# Patient Record
Sex: Female | Born: 1965 | Race: White | Hispanic: No | Marital: Married | Smoking: Never smoker
Health system: Southern US, Community
[De-identification: ages and names within clinical notes are randomized; demographics above are authoritative.]

## PROBLEM LIST (undated history)

## (undated) DIAGNOSIS — J45909 Unspecified asthma, uncomplicated: Secondary | ICD-10-CM

## (undated) DIAGNOSIS — F419 Anxiety disorder, unspecified: Secondary | ICD-10-CM

## (undated) DIAGNOSIS — J189 Pneumonia, unspecified organism: Secondary | ICD-10-CM

## (undated) DIAGNOSIS — E119 Type 2 diabetes mellitus without complications: Secondary | ICD-10-CM

## (undated) DIAGNOSIS — K219 Gastro-esophageal reflux disease without esophagitis: Secondary | ICD-10-CM

## (undated) DIAGNOSIS — G709 Myoneural disorder, unspecified: Secondary | ICD-10-CM

## (undated) HISTORY — PX: TONSILLECTOMY: SUR1361

## (undated) HISTORY — PX: LAPAROSCOPIC GASTRIC BANDING: SHX1100

## (undated) HISTORY — DX: Unspecified asthma, uncomplicated: J45.909

## (undated) HISTORY — DX: Type 2 diabetes mellitus without complications: E11.9

## (undated) HISTORY — DX: Gastro-esophageal reflux disease without esophagitis: K21.9

## (undated) HISTORY — PX: CHOLECYSTECTOMY: SHX55

---

## 2014-09-25 ENCOUNTER — Other Ambulatory Visit: Payer: Self-pay | Admitting: Surgery

## 2014-11-01 ENCOUNTER — Ambulatory Visit: Payer: Self-pay | Admitting: Dietician

## 2014-11-26 ENCOUNTER — Ambulatory Visit (HOSPITAL_COMMUNITY)
Admission: RE | Admit: 2014-11-26 | Discharge: 2014-11-26 | Disposition: A | Discharge status: Home / Self Care | Payer: Managed Care, Other (non HMO) | Source: Ambulatory Visit | Attending: Surgery | Admitting: Surgery

## 2014-11-26 ENCOUNTER — Encounter (HOSPITAL_COMMUNITY)
Admission: RE | Disposition: A | Discharge status: Home / Self Care | Payer: Self-pay | Source: Ambulatory Visit | Attending: Surgery

## 2014-11-26 ENCOUNTER — Other Ambulatory Visit: Payer: Self-pay

## 2014-11-26 DIAGNOSIS — K224 Dyskinesia of esophagus: Secondary | ICD-10-CM | POA: Insufficient documentation

## 2014-11-26 DIAGNOSIS — Z9884 Bariatric surgery status: Secondary | ICD-10-CM | POA: Diagnosis not present

## 2014-11-26 HISTORY — PX: BREATH TEK H PYLORI: SHX5422

## 2014-11-26 SURGERY — BREATH TEST, FOR HELICOBACTER PYLORI

## 2014-11-27 ENCOUNTER — Encounter (HOSPITAL_COMMUNITY): Payer: Self-pay | Admitting: Surgery

## 2014-11-27 NOTE — Progress Notes (Signed)
   11/26/14 0714  BREATH TEK ASSESSMENT  Time of Last PO Intake 2000  Baseline Breath At: 0750  Pranactin Given At: 0752  Post-Dose Breath At: 0807  Sample 1 2.1  Sample 2 2.3  Test Negative

## 2015-01-03 ENCOUNTER — Encounter: Payer: Managed Care, Other (non HMO) | Attending: Surgery | Admitting: Dietician

## 2015-01-03 ENCOUNTER — Encounter: Payer: Self-pay | Admitting: Dietician

## 2015-01-03 VITALS — Ht 66.0 in | Wt 254.8 lb

## 2015-01-03 DIAGNOSIS — E669 Obesity, unspecified: Secondary | ICD-10-CM | POA: Insufficient documentation

## 2015-01-03 DIAGNOSIS — Z6841 Body Mass Index (BMI) 40.0 and over, adult: Secondary | ICD-10-CM | POA: Insufficient documentation

## 2015-01-03 DIAGNOSIS — Z713 Dietary counseling and surveillance: Secondary | ICD-10-CM | POA: Insufficient documentation

## 2015-01-03 NOTE — Progress Notes (Signed)
  Pre-Op Assessment Visit:  Pre-Operative RYGB Surgery  Medical Nutrition Therapy:  Appt start time: 0910   End time:  0940.  Patient was seen on 01/03/2015 for Pre-Operative Nutrition Assessment. Assessment and letter of approval faxed to [facility] Bariatric Surgery Program coordinator on 01/03/2015.   Preferred Learning Style:   No preference indicated   Learning Readiness:   Ready  Handouts given during visit include:  Pre-Op Goals Bariatric Surgery Protein Shakes   During the appointment today the following Pre-Op Goals were reviewed with the patient: Maintain or lose weight as instructed by your surgeon Make healthy food choices Begin to limit portion sizes Limited concentrated sugars and fried foods Keep fat/sugar in the single digits per serving on   food labels Practice CHEWING your food  (aim for 30 chews per bite or until applesauce consistency) Practice not drinking 15 minutes before, during, and 30 minutes after each meal/snack Avoid all carbonated beverages  Avoid/limit caffeinated beverages  Avoid all sugar-sweetened beverages Consume 3 meals per day; eat every 3-5 hours Make a list of non-food related activities Aim for 64-100 ounces of FLUID daily  Aim for at least 60-80 grams of PROTEIN daily Look for a liquid protein source that contain ?15 g protein and ?5 g carbohydrate  (ex: shakes, drinks, shots)  Patient-Centered Goals: Goals: would like to stop progression of diabetes, be more active with kids/family.  10 level of confidence/10 level of importance.  Demonstrated degree of understanding via:  Teach Back  Teaching Method Utilized:  Visual Auditory Hands on  Barriers to learning/adherence to lifestyle change: none  Patient to call the [facility] to enroll in Pre-Op and Post-Op Nutrition Education when surgery date is scheduled.

## 2015-01-03 NOTE — Patient Instructions (Signed)

## 2015-03-23 ENCOUNTER — Encounter: Payer: Managed Care, Other (non HMO) | Attending: Surgery

## 2015-03-23 DIAGNOSIS — Z6841 Body Mass Index (BMI) 40.0 and over, adult: Secondary | ICD-10-CM | POA: Diagnosis not present

## 2015-03-23 DIAGNOSIS — E669 Obesity, unspecified: Secondary | ICD-10-CM | POA: Diagnosis present

## 2015-03-23 DIAGNOSIS — Z713 Dietary counseling and surveillance: Secondary | ICD-10-CM | POA: Insufficient documentation

## 2015-03-24 NOTE — Progress Notes (Signed)
  Pre-Operative Nutrition Class:  Appt start time: 9518   End time:  1830.  Patient was seen on 03/23/15 for Pre-Operative Bariatric Surgery Education at the [facility].   Surgery date: 04/13/15 Surgery type: RYGB Start weight at [facility]: 255 lbs on 01/03/15 Weight today: 255.5 lbs  TANITA  BODY COMP RESULTS  03/23/15   BMI (kg/m^2) 41.2   Fat Mass (lbs) 119.5   Fat Free Mass (lbs) 136   Total Body Water (lbs) 99.5    Samples given per MNT protocol. Patient educated on appropriate usage: Celebrate Multivitamin chew (berry - qty 1) Lot #: [id number] Exp: 07/2016  Celebrate Calcium Citrate chew (caramel - qty 1) Lot #: [id number] Exp:07/2016  PB2 (chocolate - qty 1) Lot #: N/[id number] Exp: 03/2015  Renee Pain Protein Powder (chicken soup - qty 1) Lot #: [id number] Exp: 11/2015  The following the learning objectives were met by the patient during this course:  Identify Pre-Op Dietary Goals and will begin 2 weeks pre-operatively  Identify appropriate sources of fluids and proteins   State protein recommendations and appropriate sources pre and post-operatively  Identify Post-Operative Dietary Goals and will follow for 2 weeks post-operatively  Identify appropriate multivitamin and calcium sources  Describe the need for physical activity post-operatively and will follow MD recommendations  State when to call healthcare provider regarding medication questions or post-operative complications  Handouts given during class include:  Pre-Op Bariatric Surgery Diet Handout  Protein Shake Handout  Post-Op Bariatric Surgery Nutrition Handout  BELT Program Information Flyer  Support Group Information Flyer  [facility] Bariatric Supplements Price List  Follow-Up Plan: Patient will follow-up at [facility] 2 weeks post operatively for diet advancement per MD.

## 2015-03-26 ENCOUNTER — Other Ambulatory Visit: Payer: Self-pay | Admitting: Surgery

## 2015-04-09 ENCOUNTER — Inpatient Hospital Stay (HOSPITAL_COMMUNITY): Admission: RE | Admit: 2015-04-09 | Payer: Managed Care, Other (non HMO) | Source: Ambulatory Visit

## 2015-04-09 NOTE — Patient Instructions (Addendum)
Mia PickerelLori Gillie  04/09/2015   Your procedure is scheduled on: 04-13-15 Monday  [facility]  [facility] 3rd floor to  Short Stay Center at 0515  AM.  Call this number if you have problems the morning of surgery [phone]   Remember: ONLY 1 PERSON MAY GO WITH YOU TO SHORT STAY TO GET  READY MORNING OF YOUR SURGERY.  Do not eat food or drink liquids :After Midnight.              Bowel prep as instructed     Take these medicines the morning of surgery with A SIP OF WATER: Escitalopram (Lexapro). Ranitidine (Zantac). Use/bring Inhalers. DO NOT TAKE ANY DIABETIC MEDICATIONS DAY OF YOUR SURGERY                               You may not have any metal on your body including hair pins and              piercings  Do not wear jewelry, make-up, lotions, powders or perfumes, deodorant             Do not wear nail polish.  Do not shave  48 hours prior to surgery.               Do not bring valuables to the hospital. [facility] IS NOT             RESPONSIBLE   FOR VALUABLES.  Contacts, dentures or bridgework may not be worn into surgery.  Leave suitcase in the car. After surgery it may be brought to your room.     :  Special Instructions: coughing and deep breathing exercises, leg exercises              Please read over the following fact sheets you were given: _____________________________________________________________________             [facility] - Preparing for Surgery Before surgery, you can play an important role.  Because skin is not sterile, your skin needs to be as free of germs as possible.  You can reduce the number of germs on your skin by washing with CHG (chlorahexidine gluconate) soap before surgery.  CHG is an antiseptic cleaner which kills germs and bonds with the skin to continue killing germs even after washing. Please DO NOT use if you have an allergy to CHG or antibacterial soaps.  If your skin becomes  reddened/irritated stop using the CHG and inform your nurse when you arrive at Short Stay. Do not shave (including legs and underarms) for at least 48 hours prior to the first CHG shower.  You may shave your face/neck. Please follow these instructions carefully:  1.  Shower with CHG Soap the night before surgery and the  morning of Surgery.  2.  If you choose to wash your hair, wash your hair first as usual with your  normal  shampoo.  3.  After you shampoo, rinse your hair and body thoroughly to remove the  shampoo.                           4.  Use CHG as you would any other liquid soap.  You can apply chg directly  to the skin and wash  Gently with a scrungie or clean washcloth.  5.  Apply the CHG Soap to your body ONLY FROM THE NECK DOWN.   Do not use on face/ open                           Wound or open sores. Avoid contact with eyes, ears mouth and genitals (private parts).                       Wash face,  Genitals (private parts) with your normal soap.             6.  Wash thoroughly, paying special attention to the area where your surgery  will be performed.  7.  Thoroughly rinse your body with warm water from the neck down.  8.  DO NOT shower/wash with your normal soap after using and rinsing off  the CHG Soap.                9.  Pat yourself dry with a clean towel.            10.  Wear clean pajamas.            11.  Place clean sheets on your bed the night of your first shower and do not  sleep with pets. Day of Surgery : Do not apply any lotions/deodorants the morning of surgery.  Please wear clean clothes to the hospital/surgery center.  FAILURE TO FOLLOW THESE INSTRUCTIONS MAY RESULT IN THE CANCELLATION OF YOUR SURGERY PATIENT SIGNATURE_________________________________  NURSE SIGNATURE__________________________________  ________________________________________________________________________   Adam Phenix  An incentive spirometer is a tool that  can help keep your lungs clear and active. This tool measures how well you are filling your lungs with each breath. Taking long deep breaths may help reverse or decrease the chance of developing breathing (pulmonary) problems (especially infection) following:  A long period of time when you are unable to move or be active. BEFORE THE PROCEDURE   If the spirometer includes an indicator to show your best effort, your nurse or respiratory therapist will set it to a desired goal.  If possible, sit up straight or lean slightly forward. Try not to slouch.  Hold the incentive spirometer in an upright position. INSTRUCTIONS FOR USE  1. Sit on the edge of your bed if possible, or sit up as far as you can in bed or on a chair. 2. Hold the incentive spirometer in an upright position. 3. Breathe out normally. 4. Place the mouthpiece in your mouth and seal your lips tightly around it. 5. Breathe in slowly and as deeply as possible, raising the piston or the ball toward the top of the column. 6. Hold your breath for 3-5 seconds or for as long as possible. Allow the piston or ball to fall to the bottom of the column. 7. Remove the mouthpiece from your mouth and breathe out normally. 8. Rest for a few seconds and repeat Steps 1 through 7 at least 10 times every 1-2 hours when you are awake. Take your time and take a few normal breaths between deep breaths. 9. The spirometer may include an indicator to show your best effort. Use the indicator as a goal to work toward during each repetition. 10. After each set of 10 deep breaths, practice coughing to be sure your lungs are clear. If you have an incision (the cut made at the time of surgery),  support your incision when coughing by placing a pillow or rolled up towels firmly against it. Once you are able to get out of bed, walk around indoors and cough well. You may stop using the incentive spirometer when instructed by your caregiver.  RISKS AND  COMPLICATIONS  Take your time so you do not get dizzy or light-headed.  If you are in pain, you may need to take or ask for pain medication before doing incentive spirometry. It is harder to take a deep breath if you are having pain. AFTER USE  Rest and breathe slowly and easily.  It can be helpful to keep track of a log of your progress. Your caregiver can provide you with a simple table to help with this. If you are using the spirometer at home, follow these instructions: [place] IF:   You are having difficultly using the spirometer.  You have trouble using the spirometer as often as instructed.  Your pain medication is not giving enough relief while using the spirometer.  You develop fever of 100.5 F (38.1 C) or higher. SEEK IMMEDIATE MEDICAL CARE IF:   You cough up bloody sputum that had not been present before.  You develop fever of 102 F (38.9 C) or greater.  You develop worsening pain at or near the incision site. MAKE SURE YOU:   Understand these instructions.  Will watch your condition.  Will get help right away if you are not doing well or get worse. Document Released: 09/26/2006 Document Revised: 08/08/2011 Document Reviewed: 11/27/2006 [facility] Patient Information 2014 [place], [place].   ________________________________________________________________________

## 2015-04-10 ENCOUNTER — Encounter (HOSPITAL_COMMUNITY)
Admission: RE | Admit: 2015-04-10 | Discharge: 2015-04-10 | Disposition: A | Discharge status: Home / Self Care | Payer: Managed Care, Other (non HMO) | Source: Ambulatory Visit | Attending: Surgery | Admitting: Surgery

## 2015-04-10 ENCOUNTER — Encounter (HOSPITAL_COMMUNITY): Payer: Self-pay

## 2015-04-10 HISTORY — DX: Anxiety disorder, unspecified: F41.9

## 2015-04-10 HISTORY — DX: Pneumonia, unspecified organism: J18.9

## 2015-04-10 HISTORY — DX: Myoneural disorder, unspecified: G70.9

## 2015-04-10 LAB — CBC WITH DIFFERENTIAL/PLATELET
Basophils Absolute: 0.1 10*3/uL (ref 0.0–0.1)
Basophils Relative: 0 %
EOS ABS: 0.3 10*3/uL (ref 0.0–0.7)
EOS PCT: 3 %
HCT: 41.5 % (ref 36.0–46.0)
Hemoglobin: 13.6 g/dL (ref 12.0–15.0)
LYMPHS ABS: 2.5 10*3/uL (ref 0.7–4.0)
LYMPHS PCT: 22 %
MCH: 27.6 pg (ref 26.0–34.0)
MCHC: 32.8 g/dL (ref 30.0–36.0)
MCV: 84.3 fL (ref 78.0–100.0)
MONO ABS: 0.7 10*3/uL (ref 0.1–1.0)
Monocytes Relative: 7 %
Neutro Abs: 7.7 10*3/uL (ref 1.7–7.7)
Neutrophils Relative %: 68 %
PLATELETS: 292 10*3/uL (ref 150–400)
RBC: 4.92 MIL/uL (ref 3.87–5.11)
RDW: 14.3 % (ref 11.5–15.5)
WBC: 11.3 10*3/uL — ABNORMAL HIGH (ref 4.0–10.5)

## 2015-04-10 LAB — COMPREHENSIVE METABOLIC PANEL
ALT: 38 U/L (ref 14–54)
ANION GAP: 8 (ref 5–15)
AST: 23 U/L (ref 15–41)
Albumin: 4.5 g/dL (ref 3.5–5.0)
Alkaline Phosphatase: 70 U/L (ref 38–126)
BUN: 21 mg/dL — ABNORMAL HIGH (ref 6–20)
CHLORIDE: 106 mmol/L (ref 101–111)
CO2: 25 mmol/L (ref 22–32)
CREATININE: 0.71 mg/dL (ref 0.44–1.00)
Calcium: 9.6 mg/dL (ref 8.9–10.3)
Glucose, Bld: 109 mg/dL — ABNORMAL HIGH (ref 65–99)
POTASSIUM: 4.6 mmol/L (ref 3.5–5.1)
Sodium: 139 mmol/L (ref 135–145)
Total Bilirubin: 0.7 mg/dL (ref 0.3–1.2)
Total Protein: 8.1 g/dL (ref 6.5–8.1)

## 2015-04-10 NOTE — Progress Notes (Signed)
04-10-15 - talked with Dr. Council Mechanicenenny regarding pts. Upper GI/KUB results from 11-26-14 (mild to moderate esophageal dysmotility).  No further instructions given.

## 2015-04-10 NOTE — Progress Notes (Signed)
11-26-14 - EKG - EPIC 11-26-14 - Upper Abd/KUB (mild to moderate esophageal dysmotility) - EPIC

## 2015-04-10 NOTE — Progress Notes (Signed)
04-10-15 - at preop appt. On 04-10-15, pt. Stated she has pre-op appt. With surgeon on 04-10-15.  Will get bowel prep instructions then.

## 2015-04-12 NOTE — H&P (Signed)
Mia Horton  Location: [facility] Patient #: [id number] DOB: April 30, 1966 Married / Language: English / Race: White Female   History of Present Illness   Patient words: pre-op sleeve.   The patient is a 49 year old female who presents for a bariatric surgery evaluation.   Her PCP is dr. Elpidio Anis.   She comes by herself. She is from [place] near [place]. She is here because of her insurance. I operated on her husband, Gwendloyn Forsee , for RYGB on 03/23/2015. He is doing well.   She is for conversion of a lap band to RYGB on 04/13/2015. She has thought a lot about this. She is down about 30 pounds since I saw her in April, 2016.  I have been over with her the per op for surgery, the surgery and hospitalization, and the post op. The biggest unknown factor is the anatomy around the lap band she and can I get it out and continue the operation. The band looks in normal location in the UGI.  UGI - 11/26/2014 - normal band placement, some esophageal dysmotility She had a psych eval by Dr. Kimberlee Nearing of [place], [place], on 01/19/2015  History of weight issues:  She is here for weight loss surgery. Her initial weight is 284 and BMI is 45.87.  The patient had a lap band placed in 2007 in [place]. She did well and lost about 100 pounds. However, she had several personal issues which interfered with her life. She had a loss of job, her mother had cancer and died at age 70, she had financial problems. This led to her stopping her diet and stopping her exercise. She had an upper GI x-ray at the time that showed her lap band was too tight and all the fluid was removed from her lap band. She does not think she has much in her lap band. All these events occurred over 4 years ago.  She moved to the [place] about 3 years ago. Her husband is on disability. 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  She has tried multiple  diets including Weight Watchers, Atkins, and she's going to the gym. I spent some time talking to her about trying to again used her lap band. She's not really interested in this. She also lives in [place], near [place]. This would make transportation difficult between our office in her home.   Per the 1991 NIH Consensus Statement, the patient is a candidate for bariatric surgery. The patient attended an information session and reviewed the types of bariatric surgery.   The patient is interested removal of the lap band and in the Roux en Y Gastric Bypass. I discussed with the patient the indications and risks of bariatric surgery. The potential risks of surgery include, but are not limited to, bleeding, infection, leak from the bowel, DVT and PE, open surgery, long term nutrition consequences, and death. The patient understands the importance of compliance and long term follow-up with our group after surgery.  Past medical history: 1. Noncardiac chest pain 2. Asthma 3. Remote history of duodenal ulcer 4. History of cholecystectomy in 2000 5. Colonoscopy in 2010 for rectal bleeding 6. Juliann Pares IUD she's had for 2 years. 7. Back pain which is chronic. She is currently not seeing a physician for this. 8. Diabetes mellitus Hemoglobin A1c-7.7. She is on metformin 500 mg twice a day. 9. Anxiety. Struggle with her mother's death, in that she was closed her mother.  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 Has 2 sons ages 6516 and 6117.   Other Problems Kandis Cocking(Sohan Potvin H Tamilyn Lupien, MD; 04/10/2015 1:27 PM) Anxiety Disorder Arthritis Asthma Back Pain Cholelithiasis Depression Diabetes Mellitus Gastric Ulcer Gastroesophageal Reflux Disease Hemorrhoids Migraine Headache  Past Surgical History Kandis Cocking(Cianni Manny H Hanin Decook, MD; 04/10/2015 1:27  PM) Cesarean Section - Multiple Gallbladder Surgery - Laparoscopic Lap Band Oral Surgery Tonsillectomy  Allergies Doristine Devoid(Chemira Jones, CMA; 04/10/2015 12:30 PM) No Known Drug Allergies04/28/2016  Medication History Doristine Devoid(Chemira Jones, CMA; 04/10/2015 12:30 PM) Hydrocodone-Acetaminophen (5-325MG  Tablet, Oral) Active. ClonazePAM (1MG  Tablet, Oral) Active. Amitriptyline HCl (100MG  Tablet, Oral) Active. Clotrimazole-Betamethasone (1-0.05% Cream, External) Active. Escitalopram Oxalate (20MG  Tablet, Oral) Active. Gabapentin (300MG  Capsule, Oral) Active. MetFORMIN HCl ER (500MG  Tablet ER 24HR, Oral) Active. MetFORMIN HCl (850MG  Tablet, Oral) Active. ProAir HFA (108 (90 Base)MCG/ACT Aerosol Soln, Inhalation) Active. Vitamin D (Ergocalciferol) (50000UNIT Capsule, Oral) Active. Medications Reconciled  Vitals (Chemira Jones CMA; 04/10/2015 12:29 PM) 04/10/2015 12:29 PM Weight: 249.6 lb Height: 66in Body Surface Area: 2.2 m Body Mass Index: 40.29 kg/m  Temp.: 98.30F(Oral)  Pulse: 92 (Regular)  BP: 140/98 (Sitting, Left Arm, Standard)   Physical Exam: General: WN obese WF alert and generally healthy appearing. HEENT: Normal. Pupils equal.  Neck: Supple. No mass. No thyroid mass.  Lymph Nodes: No supraclavicular or cervical nodes.  Lungs: Clear to auscultation and symmetric breath sounds. Heart: RRR. No murmur or rub.  Abdomen: Soft. No mass. No tenderness. No hernia. Normal bowel sounds.  Lap band port in upper midline of abdomen. She is more apple than pear. The wieght loss had done her well.  Extremities: Good strength and ROM in upper and lower extremities. Tattoos of lower extremities.  Neurologic: Grossly intact to motor and sensory function.  Assessment & Plan  1.  MORBID OBESITY WITH BMI OF 45.0-49.9, ADULT (Z61.09(Z68.42)  Impression: Scheduled for surgery - removal of lap band and RYGB on Monday, 04/13/2015.   She is given her  bowel prep   She is given her post op oxycodone.  2.  HISTORY OF LAPAROSCOPIC ADJUSTABLE GASTRIC BANDING (Z98.84)  Story: This was placed in [place] in 2007.  Impression: Plan removal on 04/13/2015 - for conversion to RYGB  3. Asthma 4. Remote history of duodenal ulcer 5. History of cholecystectomy in 2000 6. Colonoscopy in 2010 for rectal bleeding 7. Juliann ParesMirvina IUD she's had for 2 years. 8. Back pain which is chronic. She is currently not seeing a physician for this. 9. Diabetes mellitus Hemoglobin A1c - 7.7. She is on metformin 500 mg twice a day. 10. Anxiety   Ovidio Kinavid Isami Mehra, MD, [facility] Pager: [phone] Office phone:  [phone]

## 2015-04-13 ENCOUNTER — Inpatient Hospital Stay (HOSPITAL_COMMUNITY): Payer: Managed Care, Other (non HMO) | Admitting: Certified Registered Nurse Anesthetist

## 2015-04-13 ENCOUNTER — Inpatient Hospital Stay (HOSPITAL_COMMUNITY)
Admission: AD | Admit: 2015-04-13 | Discharge: 2015-04-15 | DRG: 621 | Disposition: A | Discharge status: Home / Self Care | Payer: Managed Care, Other (non HMO) | Source: Ambulatory Visit | Attending: Surgery | Admitting: Surgery

## 2015-04-13 ENCOUNTER — Encounter (HOSPITAL_COMMUNITY)
Admission: AD | Disposition: A | Discharge status: Home / Self Care | Payer: Self-pay | Source: Ambulatory Visit | Attending: Surgery

## 2015-04-13 ENCOUNTER — Encounter (HOSPITAL_COMMUNITY): Payer: Self-pay | Admitting: *Deleted

## 2015-04-13 DIAGNOSIS — E119 Type 2 diabetes mellitus without complications: Secondary | ICD-10-CM | POA: Diagnosis present

## 2015-04-13 DIAGNOSIS — Z01812 Encounter for preprocedural laboratory examination: Secondary | ICD-10-CM | POA: Diagnosis not present

## 2015-04-13 DIAGNOSIS — J45909 Unspecified asthma, uncomplicated: Secondary | ICD-10-CM | POA: Diagnosis present

## 2015-04-13 DIAGNOSIS — F419 Anxiety disorder, unspecified: Secondary | ICD-10-CM | POA: Diagnosis present

## 2015-04-13 DIAGNOSIS — Z9884 Bariatric surgery status: Secondary | ICD-10-CM

## 2015-04-13 DIAGNOSIS — K660 Peritoneal adhesions (postprocedural) (postinfection): Secondary | ICD-10-CM | POA: Diagnosis present

## 2015-04-13 DIAGNOSIS — Z975 Presence of (intrauterine) contraceptive device: Secondary | ICD-10-CM | POA: Diagnosis not present

## 2015-04-13 DIAGNOSIS — G8929 Other chronic pain: Secondary | ICD-10-CM | POA: Diagnosis present

## 2015-04-13 DIAGNOSIS — Z7984 Long term (current) use of oral hypoglycemic drugs: Secondary | ICD-10-CM | POA: Diagnosis not present

## 2015-04-13 DIAGNOSIS — Z8711 Personal history of peptic ulcer disease: Secondary | ICD-10-CM

## 2015-04-13 DIAGNOSIS — M1990 Unspecified osteoarthritis, unspecified site: Secondary | ICD-10-CM | POA: Diagnosis present

## 2015-04-13 DIAGNOSIS — M549 Dorsalgia, unspecified: Secondary | ICD-10-CM | POA: Diagnosis present

## 2015-04-13 DIAGNOSIS — F329 Major depressive disorder, single episode, unspecified: Secondary | ICD-10-CM | POA: Diagnosis present

## 2015-04-13 DIAGNOSIS — Z6841 Body Mass Index (BMI) 40.0 and over, adult: Secondary | ICD-10-CM

## 2015-04-13 DIAGNOSIS — Z9049 Acquired absence of other specified parts of digestive tract: Secondary | ICD-10-CM

## 2015-04-13 DIAGNOSIS — K219 Gastro-esophageal reflux disease without esophagitis: Secondary | ICD-10-CM | POA: Diagnosis present

## 2015-04-13 DIAGNOSIS — Z9889 Other specified postprocedural states: Secondary | ICD-10-CM | POA: Diagnosis not present

## 2015-04-13 HISTORY — PX: GASTRIC ROUX-EN-Y: SHX5262

## 2015-04-13 LAB — GLUCOSE, CAPILLARY
GLUCOSE-CAPILLARY: 106 mg/dL — AB (ref 65–99)
GLUCOSE-CAPILLARY: 129 mg/dL — AB (ref 65–99)
GLUCOSE-CAPILLARY: 123 mg/dL — AB (ref 65–99)
Glucose-Capillary: 128 mg/dL — ABNORMAL HIGH (ref 65–99)
Glucose-Capillary: 120 mg/dL — ABNORMAL HIGH (ref 65–99)

## 2015-04-13 LAB — PREGNANCY, URINE: Preg Test, Ur: NEGATIVE

## 2015-04-13 LAB — HEMOGLOBIN AND HEMATOCRIT, BLOOD
HCT: 35.2 % — ABNORMAL LOW (ref 36.0–46.0)
Hemoglobin: 11.5 g/dL — ABNORMAL LOW (ref 12.0–15.0)

## 2015-04-13 SURGERY — LAPAROSCOPIC ROUX-EN-Y GASTRIC BYPASS WITH UPPER ENDOSCOPY
Anesthesia: General

## 2015-04-13 MED ORDER — CEFOTETAN DISODIUM-DEXTROSE 2-2.08 GM-% IV SOLR
INTRAVENOUS | Status: AC
  Filled 2015-04-13: qty 50

## 2015-04-13 MED ORDER — SUCCINYLCHOLINE CHLORIDE 20 MG/ML IJ SOLN
INTRAMUSCULAR | Status: DC | PRN
  Administered 2015-04-13: 100 mg via INTRAVENOUS

## 2015-04-13 MED ORDER — MIDAZOLAM HCL 2 MG/2ML IJ SOLN
INTRAMUSCULAR | Status: AC
  Filled 2015-04-13: qty 4

## 2015-04-13 MED ORDER — STERILE WATER FOR IRRIGATION IR SOLN
Status: DC | PRN
  Administered 2015-04-13: 1000 mL

## 2015-04-13 MED ORDER — TISSEEL VH 10 ML EX KIT
PACK | CUTANEOUS | Status: AC
  Filled 2015-04-13: qty 1

## 2015-04-13 MED ORDER — HEPARIN SODIUM (PORCINE) 5000 UNIT/ML IJ SOLN
5000.0000 [IU] | INTRAMUSCULAR | Status: AC
  Administered 2015-04-13: 5000 [IU] via SUBCUTANEOUS
  Filled 2015-04-13: qty 1

## 2015-04-13 MED ORDER — SUGAMMADEX SODIUM 200 MG/2ML IV SOLN
INTRAVENOUS | Status: DC | PRN
  Administered 2015-04-13: 200 mg via INTRAVENOUS

## 2015-04-13 MED ORDER — PREMIER PROTEIN SHAKE
2.0000 [oz_av] | Freq: Four times a day (QID) | ORAL | Status: DC
  Administered 2015-04-15: 2 [oz_av] via ORAL

## 2015-04-13 MED ORDER — OXYCODONE HCL 5 MG/5ML PO SOLN
5.0000 mg | ORAL | Status: DC | PRN
  Administered 2015-04-15 (×3): 10 mg via ORAL
  Filled 2015-04-13 (×3): qty 10

## 2015-04-13 MED ORDER — LIDOCAINE HCL (CARDIAC) 20 MG/ML IV SOLN
INTRAVENOUS | Status: AC
  Filled 2015-04-13: qty 5

## 2015-04-13 MED ORDER — ONDANSETRON HCL 4 MG/2ML IJ SOLN
INTRAMUSCULAR | Status: DC | PRN
  Administered 2015-04-13: 4 mg via INTRAVENOUS

## 2015-04-13 MED ORDER — FENTANYL CITRATE (PF) 250 MCG/5ML IJ SOLN
INTRAMUSCULAR | Status: AC
  Filled 2015-04-13: qty 25

## 2015-04-13 MED ORDER — PROPOFOL 10 MG/ML IV BOLUS
INTRAVENOUS | Status: DC | PRN
  Administered 2015-04-13: 200 mg via INTRAVENOUS

## 2015-04-13 MED ORDER — OXYCODONE HCL 5 MG/5ML PO SOLN
5.0000 mg | Freq: Once | ORAL | Status: DC | PRN
  Filled 2015-04-13: qty 5

## 2015-04-13 MED ORDER — FENTANYL CITRATE (PF) 100 MCG/2ML IJ SOLN
INTRAMUSCULAR | Status: AC
  Filled 2015-04-13: qty 4

## 2015-04-13 MED ORDER — MORPHINE SULFATE (PF) 2 MG/ML IV SOLN
2.0000 mg | INTRAVENOUS | Status: DC | PRN
  Administered 2015-04-13 (×2): 4 mg via INTRAVENOUS
  Administered 2015-04-13: 2 mg via INTRAVENOUS
  Administered 2015-04-14 – 2015-04-15 (×8): 4 mg via INTRAVENOUS
  Filled 2015-04-13: qty 1
  Filled 2015-04-13 (×10): qty 2

## 2015-04-13 MED ORDER — LIDOCAINE HCL (CARDIAC) 20 MG/ML IV SOLN
INTRAVENOUS | Status: DC | PRN
  Administered 2015-04-13: 50 mg via INTRAVENOUS

## 2015-04-13 MED ORDER — POTASSIUM CHLORIDE IN NACL 20-0.45 MEQ/L-% IV SOLN
INTRAVENOUS | Status: DC
  Administered 2015-04-13 (×2): 150 mL/h via INTRAVENOUS
  Administered 2015-04-14: via INTRAVENOUS
  Administered 2015-04-14: 150 mL/h via INTRAVENOUS
  Administered 2015-04-14: 11:00:00 via INTRAVENOUS
  Administered 2015-04-14: 150 mL/h via INTRAVENOUS
  Administered 2015-04-15: 07:00:00 via INTRAVENOUS
  Filled 2015-04-13 (×9): qty 1000

## 2015-04-13 MED ORDER — INSULIN ASPART 100 UNIT/ML ~~LOC~~ SOLN
0.0000 [IU] | SUBCUTANEOUS | Status: DC
  Administered 2015-04-13 (×2): 3 [IU] via SUBCUTANEOUS

## 2015-04-13 MED ORDER — SUGAMMADEX SODIUM 200 MG/2ML IV SOLN
INTRAVENOUS | Status: AC
  Filled 2015-04-13: qty 2

## 2015-04-13 MED ORDER — HYDROMORPHONE HCL 1 MG/ML IJ SOLN
INTRAMUSCULAR | Status: AC
  Filled 2015-04-13: qty 1

## 2015-04-13 MED ORDER — BUPIVACAINE HCL (PF) 0.25 % IJ SOLN
INTRAMUSCULAR | Status: AC
  Filled 2015-04-13: qty 30

## 2015-04-13 MED ORDER — LACTATED RINGERS IR SOLN
Status: DC | PRN
  Administered 2015-04-13: 1000 mL
  Administered 2015-04-13: 3000 mL

## 2015-04-13 MED ORDER — ROCURONIUM BROMIDE 100 MG/10ML IV SOLN
INTRAVENOUS | Status: AC
  Filled 2015-04-13: qty 1

## 2015-04-13 MED ORDER — HEPARIN SODIUM (PORCINE) 5000 UNIT/ML IJ SOLN
5000.0000 [IU] | Freq: Three times a day (TID) | INTRAMUSCULAR | Status: DC
  Administered 2015-04-13 – 2015-04-15 (×5): 5000 [IU] via SUBCUTANEOUS
  Filled 2015-04-13 (×8): qty 1

## 2015-04-13 MED ORDER — GLYCOPYRROLATE 0.2 MG/ML IJ SOLN
INTRAMUSCULAR | Status: AC
  Filled 2015-04-13: qty 1

## 2015-04-13 MED ORDER — CHLORHEXIDINE GLUCONATE 4 % EX LIQD: 60.0000 mL | Freq: Once | CUTANEOUS | Status: DC

## 2015-04-13 MED ORDER — SODIUM CHLORIDE 0.9 % IJ SOLN
INTRAMUSCULAR | Status: AC
Start: 2015-04-13 — End: 2015-04-13
  Filled 2015-04-13: qty 10

## 2015-04-13 MED ORDER — 0.9 % SODIUM CHLORIDE (POUR BTL) OPTIME
TOPICAL | Status: DC | PRN
  Administered 2015-04-13: 1000 mL

## 2015-04-13 MED ORDER — MIDAZOLAM HCL 5 MG/5ML IJ SOLN
INTRAMUSCULAR | Status: DC | PRN
  Administered 2015-04-13: 2 mg via INTRAVENOUS

## 2015-04-13 MED ORDER — FENTANYL CITRATE (PF) 250 MCG/5ML IJ SOLN
INTRAMUSCULAR | Status: DC | PRN
  Administered 2015-04-13: 100 ug via INTRAVENOUS
  Administered 2015-04-13: 150 ug via INTRAVENOUS
  Administered 2015-04-13 (×2): 100 ug via INTRAVENOUS

## 2015-04-13 MED ORDER — HYDROMORPHONE HCL 1 MG/ML IJ SOLN
INTRAMUSCULAR | Status: DC | PRN
  Administered 2015-04-13: 1 mg via INTRAVENOUS
  Administered 2015-04-13: 2 mg via INTRAVENOUS
  Administered 2015-04-13: 1 mg via INTRAVENOUS

## 2015-04-13 MED ORDER — ONDANSETRON HCL 4 MG/2ML IJ SOLN: 4.0000 mg | Freq: Four times a day (QID) | INTRAMUSCULAR | Status: DC | PRN

## 2015-04-13 MED ORDER — ACETAMINOPHEN 160 MG/5ML PO SOLN: 650.0000 mg | ORAL | Status: DC | PRN

## 2015-04-13 MED ORDER — ACETAMINOPHEN 160 MG/5ML PO SOLN: 325.0000 mg | ORAL | Status: DC | PRN

## 2015-04-13 MED ORDER — INFLUENZA VAC SPLIT QUAD 0.5 ML IM SUSY
0.5000 mL | PREFILLED_SYRINGE | INTRAMUSCULAR | Status: AC
  Administered 2015-04-15: 0.5 mL via INTRAMUSCULAR
  Filled 2015-04-13 (×2): qty 0.5

## 2015-04-13 MED ORDER — HYDROMORPHONE HCL 1 MG/ML IJ SOLN
0.2500 mg | INTRAMUSCULAR | Status: DC | PRN
  Administered 2015-04-13: 0.5 mg via INTRAVENOUS
  Administered 2015-04-13 (×2): 0.25 mg via INTRAVENOUS

## 2015-04-13 MED ORDER — ROCURONIUM BROMIDE 100 MG/10ML IV SOLN
INTRAVENOUS | Status: DC | PRN
  Administered 2015-04-13: 10 mg via INTRAVENOUS
  Administered 2015-04-13: 20 mg via INTRAVENOUS
  Administered 2015-04-13: 50 mg via INTRAVENOUS
  Administered 2015-04-13: 30 mg via INTRAVENOUS

## 2015-04-13 MED ORDER — HYDROMORPHONE HCL 2 MG/ML IJ SOLN
INTRAMUSCULAR | Status: AC
  Filled 2015-04-13: qty 1

## 2015-04-13 MED ORDER — ONDANSETRON HCL 4 MG/2ML IJ SOLN
INTRAMUSCULAR | Status: AC
  Filled 2015-04-13: qty 2

## 2015-04-13 MED ORDER — CEFOTETAN DISODIUM-DEXTROSE 2-2.08 GM-% IV SOLR
2.0000 g | INTRAVENOUS | Status: AC
  Administered 2015-04-13: 2 g via INTRAVENOUS

## 2015-04-13 MED ORDER — PROPOFOL 10 MG/ML IV BOLUS
INTRAVENOUS | Status: AC
  Filled 2015-04-13: qty 20

## 2015-04-13 MED ORDER — LACTATED RINGERS IV SOLN
INTRAVENOUS | Status: DC | PRN
  Administered 2015-04-13 (×3): via INTRAVENOUS

## 2015-04-13 MED ORDER — TISSEEL VH 10 ML EX KIT
PACK | CUTANEOUS | Status: DC | PRN
  Administered 2015-04-13 (×2): 10 mL

## 2015-04-13 MED ORDER — OXYCODONE HCL 5 MG PO TABS: 5.0000 mg | ORAL_TABLET | Freq: Once | ORAL | Status: DC | PRN

## 2015-04-13 MED ORDER — BUPIVACAINE HCL (PF) 0.25 % IJ SOLN
INTRAMUSCULAR | Status: DC | PRN
  Administered 2015-04-13: 30 mL

## 2015-04-13 MED ORDER — ONDANSETRON HCL 4 MG/2ML IJ SOLN
4.0000 mg | INTRAMUSCULAR | Status: DC | PRN
  Administered 2015-04-13 – 2015-04-14 (×2): 4 mg via INTRAVENOUS
  Filled 2015-04-13 (×2): qty 2

## 2015-04-13 SURGICAL SUPPLY — 78 items
APPLIER CLIP ROT 10 11.4 M/L (STAPLE)
APPLIER CLIP ROT 13.4 12 LRG (CLIP)
BLADE SURG 15 STRL LF DISP TIS (BLADE) ×1 IMPLANT
BLADE SURG 15 STRL SS (BLADE) ×2
CABLE HIGH FREQUENCY MONO STRZ (ELECTRODE) ×3 IMPLANT
CHLORAPREP W/TINT 26ML (MISCELLANEOUS) ×3 IMPLANT
CLIP APPLIE ROT 10 11.4 M/L (STAPLE) IMPLANT
CLIP APPLIE ROT 13.4 12 LRG (CLIP) IMPLANT
CLIP SUT LAPRA TY ABSORB (SUTURE) ×6 IMPLANT
COVER SURGICAL LIGHT HANDLE (MISCELLANEOUS) IMPLANT
DECANTER SPIKE VIAL GLASS SM (MISCELLANEOUS) IMPLANT
DEVICE SUT QUICK LOAD TK 5 (STAPLE) IMPLANT
DEVICE SUT TI-KNOT TK 5X26 (MISCELLANEOUS) IMPLANT
DEVICE SUTURE ENDOST 10MM (ENDOMECHANICALS) ×3 IMPLANT
DEVICE TI KNOT TK5 (MISCELLANEOUS)
DISSECTOR BLUNT TIP ENDO 5MM (MISCELLANEOUS) IMPLANT
DRAIN PENROSE 18X1/4 LTX STRL (WOUND CARE) ×3 IMPLANT
DRAPE CAMERA CLOSED 9X96 (DRAPES) ×3 IMPLANT
GAUZE SPONGE 4X4 16PLY XRAY LF (GAUZE/BANDAGES/DRESSINGS) ×3 IMPLANT
GLOVE BIO SURGEON STRL SZ7.5 (GLOVE) ×3 IMPLANT
GLOVE BIOGEL PI IND STRL 6.5 (GLOVE) ×2 IMPLANT
GLOVE BIOGEL PI IND STRL 7.0 (GLOVE) ×1 IMPLANT
GLOVE BIOGEL PI INDICATOR 6.5 (GLOVE) ×4
GLOVE BIOGEL PI INDICATOR 7.0 (GLOVE) ×2
GLOVE SURG SIGNA 7.5 PF LTX (GLOVE) ×3 IMPLANT
GOWN STRL REUS W/TWL XL LVL3 (GOWN DISPOSABLE) ×12 IMPLANT
HOVERMATT SINGLE USE (MISCELLANEOUS) ×3 IMPLANT
KIT BASIN OR (CUSTOM PROCEDURE TRAY) ×3 IMPLANT
KIT GASTRIC LAVAGE 34FR ADT (SET/KITS/TRAYS/PACK) ×3 IMPLANT
LIQUID BAND (GAUZE/BANDAGES/DRESSINGS) ×3 IMPLANT
LUBRICANT JELLY K Y 4OZ (MISCELLANEOUS) ×3 IMPLANT
NEEDLE SPNL 22GX3.5 QUINCKE BK (NEEDLE) ×3 IMPLANT
PACK CARDIOVASCULAR III (CUSTOM PROCEDURE TRAY) ×3 IMPLANT
PEN SKIN MARKING BROAD (MISCELLANEOUS) ×3 IMPLANT
QUICK LOAD TK 5 (STAPLE)
RELOAD 45 VASCULAR/THIN (ENDOMECHANICALS) ×6 IMPLANT
RELOAD ENDO STITCH 2.0 (ENDOMECHANICALS) ×24
RELOAD STAPLE TA45 3.5 REG BLU (ENDOMECHANICALS) ×3 IMPLANT
RELOAD STAPLER BLUE 60MM (STAPLE) ×1 IMPLANT
RELOAD STAPLER GOLD 60MM (STAPLE) ×4 IMPLANT
RELOAD STAPLER WHITE 60MM (STAPLE) IMPLANT
SCISSORS LAP 5X35 DISP (ENDOMECHANICALS) ×3 IMPLANT
SEALANT SURGICAL APPL DUAL CAN (MISCELLANEOUS) ×3 IMPLANT
SET IRRIG TUBING LAPAROSCOPIC (IRRIGATION / IRRIGATOR) ×3 IMPLANT
SHEARS HARMONIC ACE PLUS 36CM (ENDOMECHANICALS) ×3 IMPLANT
SLEEVE ADV FIXATION 12X100MM (TROCAR) IMPLANT
SLEEVE XCEL OPT CAN 5 100 (ENDOMECHANICALS) IMPLANT
SOLUTION ANTI FOG 6CC (MISCELLANEOUS) ×3 IMPLANT
STAPLE ECHEON FLEX 60 POW ENDO (STAPLE) ×3 IMPLANT
STAPLER RELOAD BLUE 60MM (STAPLE) ×3
STAPLER RELOAD GOLD 60MM (STAPLE) ×12
STAPLER RELOAD WHITE 60MM (STAPLE)
SUT MNCRL AB 4-0 PS2 18 (SUTURE) ×3 IMPLANT
SUT RELOAD ENDO STITCH 2 48X1 (ENDOMECHANICALS) ×6
SUT RELOAD ENDO STITCH 2.0 (ENDOMECHANICALS) ×6
SUT SURGIDAC NAB ES-9 0 48 120 (SUTURE) IMPLANT
SUT VIC AB 2-0 SH 27 (SUTURE) ×2
SUT VIC AB 2-0 SH 27X BRD (SUTURE) ×1 IMPLANT
SUT VIC AB 3-0 SH 18 (SUTURE) ×3 IMPLANT
SUTURE RELOAD END STTCH 2 48X1 (ENDOMECHANICALS) ×6 IMPLANT
SUTURE RELOAD ENDO STITCH 2.0 (ENDOMECHANICALS) ×6 IMPLANT
SYR 10ML ECCENTRIC (SYRINGE) ×3 IMPLANT
SYR 20CC LL (SYRINGE) ×6 IMPLANT
SYR 50ML LL SCALE MARK (SYRINGE) ×3 IMPLANT
TOWEL OR 17X26 10 PK STRL BLUE (TOWEL DISPOSABLE) ×3 IMPLANT
TRAY FOLEY W/METER SILVER 14FR (SET/KITS/TRAYS/PACK) ×3 IMPLANT
TRAY FOLEY W/METER SILVER 16FR (SET/KITS/TRAYS/PACK) IMPLANT
TROCAR ADV FIXATION 11X100MM (TROCAR) IMPLANT
TROCAR ADV FIXATION 12X100MM (TROCAR) IMPLANT
TROCAR ADV FIXATION 5X100MM (TROCAR) IMPLANT
TROCAR BLADELESS OPT 5 100 (ENDOMECHANICALS) ×3 IMPLANT
TROCAR UNIVERSAL OPT 12M 100M (ENDOMECHANICALS) IMPLANT
TROCAR XCEL 12X100 BLDLESS (ENDOMECHANICALS) IMPLANT
TROCAR XCEL NON-BLD 11X100MML (ENDOMECHANICALS) ×3 IMPLANT
TUBING CONNECTING 10 (TUBING) ×2 IMPLANT
TUBING CONNECTING 10' (TUBING) ×1
TUBING ENDO SMARTCAP (MISCELLANEOUS) ×3 IMPLANT
TUBING FILTER THERMOFLATOR (ELECTROSURGICAL) ×3 IMPLANT

## 2015-04-13 NOTE — Anesthesia Procedure Notes (Signed)
Procedure Name: Intubation Date/Time: 04/13/2015 7:23 AM Performed by: Izora GalaHUGHES, Koa Palla A Pre-anesthesia Checklist: Patient identified, Timeout performed, Emergency Drugs available, Suction available and Patient being monitored Patient Re-evaluated:Patient Re-evaluated prior to inductionOxygen Delivery Method: Circle system utilized Preoxygenation: Pre-oxygenation with 100% oxygen Intubation Type: IV induction Ventilation: Mask ventilation without difficulty Laryngoscope Size: Mac and 3 Grade View: Grade II Tube type: Oral Tube size: 7.5 mm Number of attempts: 2 (1st attempt by CRNA unsuccessful, 2nd attempt by Anesthesiologist) Airway Equipment and Method: Stylet Placement Confirmation: ETT inserted through vocal cords under direct vision,  breath sounds checked- equal and bilateral and positive ETCO2 Secured at: 22 cm Tube secured with: Tape Dental Injury: Teeth and Oropharynx as per pre-operative assessment

## 2015-04-13 NOTE — Interval H&P Note (Signed)
History and Physical Interval Note:  04/13/2015 7:04 AM  Mia Horton  has presented today for surgery, with the diagnosis of Morbid Obesity  The various methods of treatment have been discussed with the patient and family.  Her husband is here with her.   After consideration of risks, benefits and other options for treatment, the patient has consented to  Procedure(s): LAPAROSCOPIC ROUX-EN-Y GASTRIC BYPASS WITH UPPER ENDOSCOPY (N/A) as a surgical intervention .  The patient's history has been reviewed, patient examined, no change in status, stable for surgery.  I have reviewed the patient's chart and labs.  Questions were answered to the patient's satisfaction.     Treasa Bradshaw H

## 2015-04-13 NOTE — Op Note (Addendum)
PATIENT:   Mia Horton DOB:   [birth year] MRN:   [id number]  DATE OF PROCEDURE: 04/13/2015                   FACILITY:  [facility]  OPERATIVE REPORT  PREOPERATIVE DIAGNOSIS:  Morbid obesity.  POSTOPERATIVE DIAGNOSIS:  Morbid obesity (weight 245, BMI of 45.87).  PROCEDURE:  Remove of lap band, revision to laparoscopic Roux-en-Y gastric bypass, antecolic, antegastric (intraoperative upper endoscopy by Andrey Campanile)  SURGEON:  Sandria Bales. Ezzard Standing, MD  FIRST ASSISTANTFredonia Highland, MD  ANESTHESIA:  General endotracheal.  Anesthesiologist: Achille Reish, MD CRNA: Kizzie Fantasia, CRNA; Hali Marry, CRNA  General  ESTIMATED BLOOD LOSS:  Minimal.  LOCAL ANESTHESIA:  30 cc of 1/4% Marcaine  COMPLICATIONS:  None.  INDICATION FOR SURGERY:  Mia Horton is a 49 y.o. WF  female who sees Anitra Lauth, MD (FAX: [phone]) as her primary care doctor.  She has completed our preoperative bariatric program and now comes for a laparoscopic Roux-en-Y gastric bypass.  She had a lap band placed in [place] in 2007.  She did well early and lost about 100 pounds, but with time and different personal issues, she gained weight back the lap band became less effective.  The indications, potential complications of surgery were explained to the patient.  Potential complications of the surgery include, but are not limited to, bleeding, infection, DVT, open surgery, and long-term nutritional consequences.  She knows that if removal of the lap band damages the stomach, I may stop at that portion of the operation.  OPERATIVE NOTE:  The patient taken to room #1 at [facility] where Ms. Saavedra underwent a general endotracheal anesthetic, supervised by Anesthesiologist: Achille Kamara, MD CRNA: Kizzie Fantasia, CRNA; Hali Marry, CRNA.  The patient was given 2 g of cefoxitin at the beginning of the procedure.  A time-out was held and surgical checklist run.  The abdomen was prepped with ChloraPrep and sterilely draped.   I accessed the abdominal cavity through the left upper quadrant using a 12 mm Optiview trocar.  I placed 5 additional trocars: 5 mm subxiphoid, 12 mm right subcostal, 12 mm right paramedian, 12 mm left paramedian, and 5 mm in the left lateral subcostal.  The abdomen was insufflated and abdominal exploration carried out.  Right and left lobes of liver unremarkable.  The stomach that I could see was unremarkable.  The patient had a moderate amount of greater omentum which draped over the bowel.  She did have adhesions of the omentum to the lower anterior peritoneal surface.  I spent about 10 minutes taking these adhesions down.  The lap band lay in normal position.  A photo was taken and placed in the chart of the initial appearance of the lap band.  There were moderate adhesions to the undersurface of the liver over the lap band.  These were taken down sharply and with energy.  I made a subxiphoid 5 mm trocar puncture, this allowed me to introduce the liver retractor under the left lobe of the liver.   I was then able to cut the lap band and removed it.  There was a circatrix of the band on the stomach.  The scar did not seem overly thick or nonpliable, therefore I did not divide the scar.  I did divide the sutures over the lap band and I found 3 sutures and divided these.  I was able to get the dissection to the Angle of Hiss.  There were also some adhesions along the lesser curvature, below the primary scar around the lap band, and these were divided.   After removing the lap band, the remaining stomach looked in good condition to continue the gastric bypass.  I was able to push the omentum and transverse colon up and identified the ligament of Treitz to start the operation.  I measured 40 cm of the jejunum, starting at the ligament of Tritz, and divided the jejunum with a white load of 45 mm Ethicon Endo-GIA stapler.  I divided a short length into the mesentery.  I measured 100 cm of jejunum for the  future gastric limb.  I put a Penrose drain on the future gastric limb of the jejunum.  I then did a side-to-side jejunojejunostomy.  I used a 45 mm white load of the Ethicon Endo-GIA stapler.  I closed the enterotomy with 2 running 2-0 Vicryl sutures.  I tested the JJ anastomosis with an alligator forceps and then covered this with Tisseel.  I closed the mesenteric defect with a running 2-0 silk suture with a Laparo-tye on each end.  I then divided the omentum with a Harmonic Scalpel.  I positioned the patient in reverse Trendelenburg.  The liver retractor was already in place for the dissection of the lap band.  I then identified the gastroesophageal junction.  I went to the left at the angle of His and made a window at the left side of the esophago-gastric junction for a target as my dissection.  I then went on the lesser curve of the stomach, measured 5 cm from the gastroesophageal junction down the lesser curve and dissected into the lesser sac from the lesser curvature side of the stomach.  I did the first firing of a 60 mm gold load Ethicon Endo-GIA stapler and then did 4 firings of the 60 mm gold load Ethicon Eschelon stapler.  I used the gold loads because the stomach seemed thicker secondary to the prior lap band surgery.  This created a gastric pouch approximately 5 cm in length and 3 cm in width.  There was no bleeding from either the pouch or the stomach remnant site.  I placed Tisseel on the pouch side along the new greater curvature.  I over sewed the gastric remnant with a locking 2-0 Vicryl suture with a Laparo-tye on each end..  I then brought the jejunum ante-colic, ante-gastric up to the new stomach pouch and placed a posterior running 2-0 Vicryl suture.  I then made an enterotomy into the stomach using the Ewald as a back stop and an enterotomy into the jejunum.  I did a stapled side-to-side gastrojejunal anastomosis using these two enterotomies with a 45 mm blue load of the  Ethicon Endo GIA stapler.  I tried to create a 2.5 cm gastrojejunal anastomosis.  I closed the enterotomy with a 2 running 2-0 Vicryl sutures.  I passed the Ewald tube through the gastrojejunal anastomosis and then did an anterior Connell suture running of 2-0 Vicryl suture for the anterior layer of the gastrojejunostomy.  The Ewald tube was then removed without difficulty.  I then closed the [place] defect with a figure-of-eight 2-0 silk suture between the mesentery of the transverse colon and the mesentery of the distal jejunum.  Dr. Andrey CampanileWilson then scrubbed out and did an intraoperative upper endoscopy.  He identified the esophagogastric junction about 42 cm, the gastrojejunal anastomosis about 48 cm.  I clamped off the small bowel.  He insufflated air and  I flooded the abdomen with saline. There was no bubbling or evidence of air leak.  He then withdrew the scope and he will dictate that portion of the operation.    I removed the port from the RUQ incision.  The port was held by 4 braided sutures - these were cut.  There was a minimal hole to the peritoneal cavity.  I closed the subcutaneous tissues with 3-0 vicryl suture.  The skin was closed with 4-0 monocryl.  I then re-inspected the anastomoses, sucked out the saline, placed Tisseel over the stomach pouch and gastrojejunal anastomosis.   The liver retractor was removed.  The trocars were removed.  There was no bleeding at any trocar site.  The skin at each trocar site was closed with a 4-0 Monocryl suture.  I infiltrated a total about 30 cc of 0.25% Marcaine at the trocar sites.    After the skin incisions were closed with sutures they were painted with LiquidBand.  The sponge and needle count were correct at the end of the case.  The patient tolerated the procedure well, was transported to the recovery room in good condition.   Ovidio Kin, MD, [facility] Surgery Pager: [phone] Office phone:  [phone]

## 2015-04-13 NOTE — Anesthesia Postprocedure Evaluation (Signed)
Anesthesia Post Note  Patient: Mia Horton  Procedure(s) Performed: Procedure(s) (LRB): LAPAROSCOPIC ROUX-EN-Y GASTRIC BYPASS WITH UPPER ENDOSCOPY, removal of lap band  (N/A)  Anesthesia type: General  Patient location: PACU  Post pain: Pain level controlled and Adequate analgesia  Post assessment: Post-op Vital signs reviewed, Patient's Cardiovascular Status Stable, Respiratory Function Stable, Patent Airway and Pain level controlled  Last Vitals:  Filed Vitals:   04/13/15 1335  BP: 121/61  Pulse: 112  Temp: 37.1 C  Resp: 12    Post vital signs: Reviewed and stable  Level of consciousness: awake, alert  and oriented  Complications: No apparent anesthesia complications

## 2015-04-13 NOTE — Transfer of Care (Signed)
Immediate Anesthesia Transfer of Care Note  Patient: Mia Horton  Procedure(s) Performed: Procedure(s): LAPAROSCOPIC ROUX-EN-Y GASTRIC BYPASS WITH UPPER ENDOSCOPY, removal of lap band  (N/A)  Patient Location: PACU  Anesthesia Type:General  Level of Consciousness: awake, alert  and oriented  Airway & Oxygen Therapy: Patient Spontanous Breathing and Patient connected to face mask oxygen  Post-op Assessment: Report given to RN and Post -op Vital signs reviewed and stable  Post vital signs: Reviewed and stable  Last Vitals:  Filed Vitals:   04/13/15 0530  BP: 144/80  Pulse: 99  Temp: 36.3 C  Resp: 18    Complications: No apparent anesthesia complications

## 2015-04-13 NOTE — Op Note (Signed)
Mia PickerelLori Horton [id number] 05/01/1966 04/13/2015  Preoperative diagnosis: morbid obesity; h/o adjustable gastric band placement, conversion to laparoscopic roux en y gastric bypass  Postoperative diagnosis: Same   Procedure: Upper endoscopy   Surgeon: Atilano InaEric M Wyatte Dames M.D., FACS   Anesthesia: Gen.   Indications for procedure: 49 yo female undergoing a conversion from laparoscopic adjustable gastric band to laparoscopic roux en y gastric bypass and an upper endoscopy was requested to evaluate the anastomosis.  Description of procedure: After we have completed the new gastrojejunostomy, I scrubbed out and obtained the Olympus endoscope. I gently placed endoscope in the patient's oropharynx and gently glided it down the esophagus without any difficulty under direct visualization. Once I was in the gastric pouch, I insufflated the pouch was air. The pouch was approximately 8-9cm (GE junction at 42 cm, anastomosis at 50cm) in size. I was able to cannulate and advanced the scope through the gastrojejunostomy. There was evidence of the prior gastric band cicatrix impression on the upper pouch but no evidence of stricture; this impression was above the anastomosis giving the pouch an hourglass appearance. Dr. Ezzard StandingNewman had placed saline in the upper abdomen. Upon further insufflation of the gastric pouch there was no evidence of bubbles. Upon further inspection of the gastric pouch, the mucosa appeared normal. There is no evidence of any mucosal abnormality. The gastric pouch and Roux limb were decompressed. The width of the gastrojejunal anastomosis was at least 2.5 cm. The scope was withdrawn. The patient tolerated this portion of the procedure well. Please see Dr Allene PyoNewman's operative note for details regarding the laparoscopic roux-en-y gastric bypass.  Mary SellaEric M. Andrey CampanileWilson, MD, FACS General, Bariatric, & Minimally Invasive Surgery [facility] [place]

## 2015-04-13 NOTE — Anesthesia Preprocedure Evaluation (Signed)
Anesthesia Evaluation  Patient identified by MRN, date of birth, ID band Patient awake    Reviewed: Allergy & Precautions, NPO status , Patient's Chart, lab work & pertinent test results  Airway Mallampati: II   Neck ROM: full    Dental   Pulmonary asthma ,    breath sounds clear to auscultation       Cardiovascular negative cardio ROS   Rhythm:regular Rate:Normal     Neuro/Psych Anxiety    GI/Hepatic GERD  ,  Endo/Other  diabetes, Type obesity  Renal/GU      Musculoskeletal   Abdominal   Peds  Hematology   Anesthesia Other Findings   Reproductive/Obstetrics                             Anesthesia Physical Anesthesia Plan  ASA: II  Anesthesia Plan: General   Post-op Pain Management:    Induction: Intravenous  Airway Management Planned: Oral ETT  Additional Equipment:   Intra-op Plan:   Post-operative Plan: Extubation in OR  Informed Consent: I have reviewed the patients History and Physical, chart, labs and discussed the procedure including the risks, benefits and alternatives for the proposed anesthesia with the patient or authorized representative who has indicated his/her understanding and acceptance.     Plan Discussed with: CRNA, Anesthesiologist and Surgeon  Anesthesia Plan Comments:         Anesthesia Quick Evaluation

## 2015-04-14 ENCOUNTER — Inpatient Hospital Stay (HOSPITAL_COMMUNITY): Payer: Managed Care, Other (non HMO)

## 2015-04-14 DIAGNOSIS — Z9889 Other specified postprocedural states: Secondary | ICD-10-CM

## 2015-04-14 LAB — CBC WITH DIFFERENTIAL/PLATELET
Basophils Absolute: 0 10*3/uL (ref 0.0–0.1)
Basophils Relative: 0 %
EOS ABS: 0.2 10*3/uL (ref 0.0–0.7)
Eosinophils Relative: 3 %
HCT: 34 % — ABNORMAL LOW (ref 36.0–46.0)
HEMOGLOBIN: 11.2 g/dL — AB (ref 12.0–15.0)
LYMPHS ABS: 1.4 10*3/uL (ref 0.7–4.0)
LYMPHS PCT: 18 %
MCH: 28.1 pg (ref 26.0–34.0)
MCHC: 32.9 g/dL (ref 30.0–36.0)
MCV: 85.4 fL (ref 78.0–100.0)
Monocytes Absolute: 0.6 10*3/uL (ref 0.1–1.0)
Monocytes Relative: 8 %
NEUTROS PCT: 71 %
Neutro Abs: 5.6 10*3/uL (ref 1.7–7.7)
Platelets: 204 10*3/uL (ref 150–400)
RBC: 3.98 MIL/uL (ref 3.87–5.11)
RDW: 14.5 % (ref 11.5–15.5)
WBC: 7.8 10*3/uL (ref 4.0–10.5)

## 2015-04-14 LAB — GLUCOSE, CAPILLARY
GLUCOSE-CAPILLARY: 94 mg/dL (ref 65–99)
GLUCOSE-CAPILLARY: 92 mg/dL (ref 65–99)
GLUCOSE-CAPILLARY: 90 mg/dL (ref 65–99)
Glucose-Capillary: 104 mg/dL — ABNORMAL HIGH (ref 65–99)
Glucose-Capillary: 89 mg/dL (ref 65–99)
Glucose-Capillary: 96 mg/dL (ref 65–99)

## 2015-04-14 LAB — HEMOGLOBIN AND HEMATOCRIT, BLOOD
HCT: 33.3 % — ABNORMAL LOW (ref 36.0–46.0)
HEMOGLOBIN: 10.9 g/dL — AB (ref 12.0–15.0)

## 2015-04-14 MED ORDER — IOHEXOL 300 MG/ML  SOLN
50.0000 mL | Freq: Once | INTRAMUSCULAR | Status: DC | PRN
  Administered 2015-04-14: 50 mL via ORAL
  Filled 2015-04-14: qty 50

## 2015-04-14 NOTE — Progress Notes (Signed)
General Surgery Note  LOS: 1 day  POD -  1 Day Post-Op  Assessment/Plan: 1.  LAPAROSCOPIC ROUX-EN-Y GASTRIC BYPASS WITH UPPER ENDOSCOPY, removal of lap band   MORBID OBESITY WITH BMI OF 45.0-49.9, ADULT   For swallow today  2. HISTORY OF LAPAROSCOPIC ADJUSTABLE GASTRIC BANDING (Z98.84) Story: This was [place] in 2007.  3. Asthma 4. Remote history of duodenal ulcer 5. History of cholecystectomy in 2000 6. Colonoscopy in 2010 for rectal bleeding 7. Juliann ParesMirvina IUD she's had for 2 years. 8. Back pain which is chronic. She is currently not seeing a physician for this. 9. Diabetes mellitus  Glucose 94 - 04/14/2015 10. Anxiety 11.  DVT prophylaxis - SQ Heparin   Active Problems:   Morbid obesity (HCC)  Subjective:  She is sorer than she thought that she would be.  But is up walking and moving well. Objective:   Filed Vitals:   04/14/15 0530  BP: 129/62  Pulse: 84  Temp: 98.6 F (37 C)  Resp: 18     Intake/Output from previous day:  11/14 0701 - 11/15 0700 In: 5002.5 [I.V.:5002.5] Out: 2300 [Urine:2100; Blood:200]  Intake/Output this shift:  Total I/O In: -  Out: 600 [Urine:600]   Physical Exam:   General: Obese wF who is alert and oriented.    HEENT: Normal. Pupils equal. .   Lungs: Clear.  IS - 2,000 cc   Abdomen: Soft, quiet.   Wound: Clean   Lab Results:    Recent Labs  04/13/15 1740 04/14/15 0500  WBC  --  7.8  HGB 11.5* 11.2*  HCT 35.2* 34.0*  PLT  --  204    BMET  No results for input(s): NA, K, CL, CO2, GLUCOSE, BUN, CREATININE, CALCIUM in the last 72 hours.  PT/INR  No results for input(s): LABPROT, INR in the last 72 hours.  ABG  No results for input(s): PHART, HCO3 in the last 72 hours.  Invalid input(s): PCO2, PO2   Studies/Results:  No results found.   Anti-infectives:   Anti-infectives    Start     Dose/Rate Route Frequency Ordered Stop   04/13/15 0528  cefoTEtan in  Dextrose 5% (CEFOTAN) IVPB 2 g     2 g Intravenous On call to O.R. 04/13/15 0528 04/13/15 0725      Ovidio Kinavid Mishawn Didion, MD, FACS Pager: [phone] [facility] Office: [phone] 04/14/2015

## 2015-04-14 NOTE — Care Management Note (Signed)
Case Management Note  Patient Details  Name: Greer PickerelLori Irmen MRN: [id number] Date of Birth: [birth year]  Subjective/Objective:       Remove of lap band, revision to laparoscopic Roux-en-Y gastric bypass             Action/Plan: Discharge planning, no needs  Expected Discharge Date:                  Expected Discharge Plan:  Home/Self Care  In-House Referral:  NA  Discharge planning Services  CM Consult  Post Acute Care Choice:  NA Choice offered to:  NA  DME Arranged:  N/A DME Agency:  NA  HH Arranged:  NA HH Agency:  NA  Status of Service:  Completed, signed off  Medicare Important Message Given:    Date Medicare IM Given:    Medicare IM give by:    Date Additional Medicare IM Given:    Additional Medicare Important Message give by:     If discussed at Long Length of Stay Meetings, dates discussed:    Additional Comments:  Alexis Goodelleele, Clanton Emanuelson K, RN 04/14/2015, 12:38 PM

## 2015-04-14 NOTE — Progress Notes (Signed)
VASCULAR LAB PRELIMINARY  PRELIMINARY  PRELIMINARY  PRELIMINARY  Bilateral lower extremity venous duplex completed.    Preliminary report:  Bilateral:  No evidence of DVT, superficial thrombosis, or Baker's Cyst.   Jovonta Levit, RVS 04/14/2015, 8:39 AM

## 2015-04-14 NOTE — Progress Notes (Signed)
Patient alert and oriented, Post op day 1.  Provided support and encouragement.  Encouraged pulmonary toilet, ambulation and small sips of liquids.  All questions answered.  Will continue to monitor. 

## 2015-04-15 LAB — CBC WITH DIFFERENTIAL/PLATELET
BASOS ABS: 0 10*3/uL (ref 0.0–0.1)
BASOS PCT: 0 %
Eosinophils Absolute: 0.5 10*3/uL (ref 0.0–0.7)
Eosinophils Relative: 6 %
HEMATOCRIT: 32.7 % — AB (ref 36.0–46.0)
HEMOGLOBIN: 10.4 g/dL — AB (ref 12.0–15.0)
LYMPHS PCT: 20 %
Lymphs Abs: 1.6 10*3/uL (ref 0.7–4.0)
MCH: 26.9 pg (ref 26.0–34.0)
MCHC: 31.8 g/dL (ref 30.0–36.0)
MCV: 84.5 fL (ref 78.0–100.0)
MONO ABS: 0.7 10*3/uL (ref 0.1–1.0)
MONOS PCT: 8 %
NEUTROS ABS: 5.2 10*3/uL (ref 1.7–7.7)
NEUTROS PCT: 66 %
Platelets: 171 10*3/uL (ref 150–400)
RBC: 3.87 MIL/uL (ref 3.87–5.11)
RDW: 14.5 % (ref 11.5–15.5)
WBC: 7.9 10*3/uL (ref 4.0–10.5)

## 2015-04-15 LAB — GLUCOSE, CAPILLARY
GLUCOSE-CAPILLARY: 100 mg/dL — AB (ref 65–99)
GLUCOSE-CAPILLARY: 92 mg/dL (ref 65–99)
Glucose-Capillary: 100 mg/dL — ABNORMAL HIGH (ref 65–99)
Glucose-Capillary: 97 mg/dL (ref 65–99)

## 2015-04-15 NOTE — Progress Notes (Addendum)
General Surgery Note  LOS: 2 days  POD -  2 Days Post-Op  Assessment/Plan: 1.  LAPAROSCOPIC ROUX-EN-Y GASTRIC BYPASS WITH UPPER ENDOSCOPY, removal of lap band   MORBID OBESITY WITH BMI OF 45.0-49.9, ADULT   Swallow okay - taking water.  Feels better today.  Will start protein drinks  She wants to go home today.  I reviewed discharge orders.  It she tolerates protein drinks, will let go home later today.  2. HISTORY OF LAPAROSCOPIC ADJUSTABLE GASTRIC BANDING (Z98.84) Story: This was placed in [place] in 2007.  3. Asthma 4. Remote history of duodenal ulcer 5. History of cholecystectomy in 2000 6. Colonoscopy in 2010 for rectal bleeding 7. Juliann ParesMirvina IUD she's had for 2 years. 8. Back pain which is chronic. She is currently not seeing a physician for this. 9. Diabetes mellitus  Glucose 100 - 04/15/2015 10. Anxiety 11.  DVT prophylaxis - SQ Heparin   Active Problems:   Morbid obesity (HCC)  Subjective:  Doing well.  Tolerating water. Wants to go home today.  Complains of some swelling of the hands.  In room by herself. Objective:   Filed Vitals:   04/15/15 0500  BP: 140/76  Pulse: 95  Temp: 98.1 F (36.7 C)  Resp: 18     Intake/Output from previous day:  11/15 0701 - 11/16 0700 In: 3840 [P.O.:240; I.V.:3600] Out: 4100 [Urine:4100]  Intake/Output this shift:      Physical Exam:   General: Obese wF who is alert and oriented.    HEENT: Normal. Pupils equal. .   Lungs: Clear.     Abdomen: Soft, has active BS.   Wound: Clean   Lab Results:     Recent Labs  04/14/15 0500 04/14/15 1612 04/15/15 0505  WBC 7.8  --  7.9  HGB 11.2* 10.9* 10.4*  HCT 34.0* 33.3* 32.7*  PLT 204  --  171    BMET  No results for input(s): NA, K, CL, CO2, GLUCOSE, BUN, CREATININE, CALCIUM in the last 72 hours.  PT/INR  No results for input(s): LABPROT, INR in the last 72 hours.  ABG  No results for input(s): PHART, HCO3 in the  last 72 hours.  Invalid input(s): PCO2, PO2   Studies/Results:  Dg Ugi W/water Sol Cm  04/14/2015  CLINICAL DATA:  Postop for gastric bypass.  Lap band revision. EXAM: WATER SOLUBLE UPPER GI SERIES TECHNIQUE: Single-column upper GI series was performed using water soluble contrast. CONTRAST:  50mL OMNIPAQUE IOHEXOL 300 MG/ML  SOLN COMPARISON:  11/26/2014 FLUOROSCOPY TIME:  If the device does not provide the exposure index: Fluoroscopy Time (in minutes and seconds):  1 minutes and 6 seconds Number of Acquired Images:  None FINDINGS: Preprocedure scout film demonstrates prior cholecystectomy. No free intraperitoneal air. Focused single-contrast exam demonstrates expected postoperative anatomy of gastric bypass with Roux-en-Y gastrojejunostomy. No contrast extravasation. No bowel obstruction. IMPRESSION: Expected appearance after gastric bypass and gastrojejunostomy. Electronically Signed   By: Jeronimo GreavesKyle  Talbot M.D.   On: 04/14/2015 10:15     Anti-infectives:   Anti-infectives    Start     Dose/Rate Route Frequency Ordered Stop   04/13/15 0528  cefoTEtan in Dextrose 5% (CEFOTAN) IVPB 2 g     2 g Intravenous On call to O.R. 04/13/15 0528 04/13/15 0725      Ovidio Kinavid Haitham Dolinsky, MD, FACS Pager: [phone] [facility] Office: [phone] 04/15/2015

## 2015-04-15 NOTE — Discharge Summary (Signed)
Physician Discharge Summary  Patient ID:  Mia Horton  MRN: [id number]  DOB/AGE: 1965/08/20 49 y.o.  Admit date: 04/13/2015 Discharge date: 04/15/2015  Discharge Diagnoses: 1. Morbid obesity (weight 245, BMI of 45.87). 2. HISTORY OF LAPAROSCOPIC ADJUSTABLE GASTRIC BANDING  Story: This was placed in [place] in 2007.  3. Asthma 4. Remote history of duodenal ulcer 5. History of cholecystectomy in 2000 6. Colonoscopy in 2010 for rectal bleeding 7. Juliann Pares IUD she's had for 2 years. 8. Back pain which is chronic. She is currently not seeing a physician for this. 9. Diabetes mellitus Glucose 100 - 04/15/2015 10. Anxiety   Active Problems:   Morbid obesity (HCC)   Operation: Procedure(s):  LAPAROSCOPIC ROUX-EN-Y GASTRIC BYPASS WITH UPPER ENDOSCOPY, removal of lap band  on 04/13/2015 - D. Ezzard Standing  Discharged Condition: good  Hospital Course: Aundraya Dripps is an 49 y.o. female whose primary care physician is Anitra Lauth, MD and who was admitted 04/13/2015 with a chief complaint of morbid obesity and failed lap band.   She was brought to the operating room on 04/13/2015 and underwent  LAPAROSCOPIC ROUX-EN-Y GASTRIC BYPASS WITH UPPER ENDOSCOPY, removal of lap band.   She did well post op .  Her swallow the first post op day was normal. She is now 2 days post op - has taken protein drinks and is ready to go home.    The discharge instructions were reviewed with the patient.  Consults: None  Significant Diagnostic Studies: Results for orders placed or performed during the hospital encounter of 04/13/15  Pregnancy, urine STAT morning of surgery  Result Value Ref Range   Preg Test, Ur NEGATIVE NEGATIVE  Glucose, capillary  Result Value Ref Range   Glucose-Capillary 106 (H) 65 - 99 mg/dL   Comment 1 Document in Chart   Glucose, capillary  Result Value Ref Range   Glucose-Capillary 129 (H) 65 - 99 mg/dL  Hemoglobin and  hematocrit, blood  Result Value Ref Range   Hemoglobin 11.5 (L) 12.0 - 15.0 g/dL   HCT 40.9 (L) 81.1 - 91.4 %  Glucose, capillary  Result Value Ref Range   Glucose-Capillary 128 (H) 65 - 99 mg/dL  Glucose, capillary  Result Value Ref Range   Glucose-Capillary 120 (H) 65 - 99 mg/dL  CBC WITH DIFFERENTIAL  Result Value Ref Range   WBC 7.8 4.0 - 10.5 K/uL   RBC 3.98 3.87 - 5.11 MIL/uL   Hemoglobin 11.2 (L) 12.0 - 15.0 g/dL   HCT 78.2 (L) 95.6 - 21.3 %   MCV 85.4 78.0 - 100.0 fL   MCH 28.1 26.0 - 34.0 pg   MCHC 32.9 30.0 - 36.0 g/dL   RDW 08.6 57.8 - 46.9 %   Platelets 204 150 - 400 K/uL   Neutrophils Relative % 71 %   Neutro Abs 5.6 1.7 - 7.7 K/uL   Lymphocytes Relative 18 %   Lymphs Abs 1.4 0.7 - 4.0 K/uL   Monocytes Relative 8 %   Monocytes Absolute 0.6 0.1 - 1.0 K/uL   Eosinophils Relative 3 %   Eosinophils Absolute 0.2 0.0 - 0.7 K/uL   Basophils Relative 0 %   Basophils Absolute 0.0 0.0 - 0.1 K/uL  Glucose, capillary  Result Value Ref Range   Glucose-Capillary 123 (H) 65 - 99 mg/dL  Hemoglobin and hematocrit, blood  Result Value Ref Range   Hemoglobin 10.9 (L) 12.0 - 15.0 g/dL   HCT 62.9 (L) 52.8 - 41.3 %  Glucose, capillary  Result  Value Ref Range   Glucose-Capillary 104 (H) 65 - 99 mg/dL  Glucose, capillary  Result Value Ref Range   Glucose-Capillary 94 65 - 99 mg/dL  Glucose, capillary  Result Value Ref Range   Glucose-Capillary 92 65 - 99 mg/dL  Glucose, capillary  Result Value Ref Range   Glucose-Capillary 96 65 - 99 mg/dL   Comment 1 Notify RN   Glucose, capillary  Result Value Ref Range   Glucose-Capillary 90 65 - 99 mg/dL  CBC with Differential  Result Value Ref Range   WBC 7.9 4.0 - 10.5 K/uL   RBC 3.87 3.87 - 5.11 MIL/uL   Hemoglobin 10.4 (L) 12.0 - 15.0 g/dL   HCT 09.832.7 (L) 11.936.0 - 14.746.0 %   MCV 84.5 78.0 - 100.0 fL   MCH 26.9 26.0 - 34.0 pg   MCHC 31.8 30.0 - 36.0 g/dL   RDW 82.914.5 56.211.5 - 13.015.5 %   Platelets 171 150 - 400 K/uL   Neutrophils  Relative % 66 %   Neutro Abs 5.2 1.7 - 7.7 K/uL   Lymphocytes Relative 20 %   Lymphs Abs 1.6 0.7 - 4.0 K/uL   Monocytes Relative 8 %   Monocytes Absolute 0.7 0.1 - 1.0 K/uL   Eosinophils Relative 6 %   Eosinophils Absolute 0.5 0.0 - 0.7 K/uL   Basophils Relative 0 %   Basophils Absolute 0.0 0.0 - 0.1 K/uL  Glucose, capillary  Result Value Ref Range   Glucose-Capillary 89 65 - 99 mg/dL  Glucose, capillary  Result Value Ref Range   Glucose-Capillary 100 (H) 65 - 99 mg/dL  Glucose, capillary  Result Value Ref Range   Glucose-Capillary 100 (H) 65 - 99 mg/dL  Glucose, capillary  Result Value Ref Range   Glucose-Capillary 92 65 - 99 mg/dL  Glucose, capillary  Result Value Ref Range   Glucose-Capillary 97 65 - 99 mg/dL    Dg Ugi W/water Sol Cm  04/14/2015  CLINICAL DATA:  Postop for gastric bypass.  Lap band revision. EXAM: WATER SOLUBLE UPPER GI SERIES TECHNIQUE: Single-column upper GI series was performed using water soluble contrast. CONTRAST:  50mL OMNIPAQUE IOHEXOL 300 MG/ML  SOLN COMPARISON:  11/26/2014 FLUOROSCOPY TIME:  If the device does not provide the exposure index: Fluoroscopy Time (in minutes and seconds):  1 minutes and 6 seconds Number of Acquired Images:  None FINDINGS: Preprocedure scout film demonstrates prior cholecystectomy. No free intraperitoneal air. Focused single-contrast exam demonstrates expected postoperative anatomy of gastric bypass with Roux-en-Y gastrojejunostomy. No contrast extravasation. No bowel obstruction. IMPRESSION: Expected appearance after gastric bypass and gastrojejunostomy. Electronically Signed   By: Jeronimo GreavesKyle  Talbot M.D.   On: 04/14/2015 10:15    Discharge Exam:  Filed Vitals:   04/15/15 1000  BP: 134/82  Pulse: 105  Temp: 97.7 F (36.5 C)  Resp: 19    General: Obese WF who is alert and generally healthy appearing.  Lungs: Clear to auscultation and symmetric breath sounds. Heart:  RRR. No murmur or rub. Abdomen: Soft. The incisions  look good. Normal bowel sounds.   Discharge Medications:     Medication List    TAKE these medications        acetaminophen 500 MG tablet  Commonly known as:  TYLENOL  Take 1,000 mg by mouth every 6 (six) hours as needed for mild pain or headache.     amitriptyline 100 MG tablet  Commonly known as:  ELAVIL  Take 100 mg by mouth at bedtime.  beclomethasone 40 MCG/ACT inhaler  Commonly known as:  QVAR  Inhale 2 puffs into the lungs 2 (two) times daily.     clonazePAM 1 MG tablet  Commonly known as:  KLONOPIN  Take 1 tablet by mouth 3 (three) times daily as needed. For anxiety     ergocalciferol 50000 UNITS capsule  Commonly known as:  VITAMIN D2  Take 50,000 Units by mouth 3 (three) times a week. Does not take on specific days of the week     escitalopram 20 MG tablet  Commonly known as:  LEXAPRO  Take 20 mg by mouth daily.     gabapentin 300 MG capsule  Commonly known as:  NEURONTIN  Take 1,500 mg by mouth 3 (three) times daily.     levonorgestrel 20 MCG/24HR IUD  Commonly known as:  MIRENA  1 each by Intrauterine route once.     metFORMIN 850 MG tablet  Commonly known as:  GLUCOPHAGE  Take 850 mg by mouth 2 (two) times daily.  Notes to Patient:  Monitor Blood Sugar Frequently and keep a log for primary care physician, you may need to adjust medication dosage with rapid weight loss.        multivitamin with minerals tablet  Take 1 tablet by mouth daily.     PROAIR HFA 108 (90 BASE) MCG/ACT inhaler  Generic drug:  albuterol  Inhale 2 puffs into the lungs 4 (four) times daily as needed. For shortness of breath     ranitidine 300 MG tablet  Commonly known as:  ZANTAC  Take 300 mg by mouth daily.        Disposition: 01-Home or Self Care      Discharge Instructions    Ambulate hourly while awake    Complete by:  As directed      Call MD for:  difficulty breathing, headache or visual disturbances    Complete by:  As directed      Call MD for:   persistant dizziness or light-headedness    Complete by:  As directed      Call MD for:  persistant nausea and vomiting    Complete by:  As directed      Call MD for:  redness, tenderness, or signs of infection (pain, swelling, redness, odor or green/yellow discharge around incision site)    Complete by:  As directed      Call MD for:  severe uncontrolled pain    Complete by:  As directed      Call MD for:  temperature >101 F    Complete by:  As directed      Diet bariatric full liquid    Complete by:  As directed      Incentive spirometry    Complete by:  As directed   Perform hourly while awake           Follow-up Information    Follow up with [facility], MD. Go on 05/01/2015.   Specialty:  General Surgery   Why:  For Post-Op Check at 11:00AM   Contact information:   [address] [place] [address] [phone]        Signed: Ovidio Kin, M.D., [facility] Surgery Office:  [phone]  04/15/2015, 12:59 PM

## 2015-04-15 NOTE — Progress Notes (Signed)
Patient alert and oriented, pain is controlled. Patient is tolerating fluids,  advanced to protein shake today, patient tolerated well. Reviewed Gastric Bypass discharge instructions with patient and patient is able to articulate understanding. Provided information on BELT program, Support Group and [facility] outpatient pharmacy. All questions answered, will continue to monitor.    

## 2015-04-15 NOTE — Discharge Instructions (Signed)

## 2015-04-15 NOTE — Plan of Care (Signed)
Problem: Food- and Nutrition-Related Knowledge Deficit (NB-1.1) Goal: Nutrition education Formal process to instruct or train a patient/client in a skill or to impart knowledge to help patients/clients voluntarily manage or modify food choices and eating behavior to maintain or improve health. Outcome: Completed/Met Date Met:  04/15/15 Nutrition Education Note  Received consult for diet education per DROP protocol.   Discussed 2 week post op diet with pt. Emphasized that liquids must be non carbonated, non caffeinated, and sugar free. Fluid goals discussed. Pt to follow up with outpatient bariatric RD for further diet progression after 2 weeks. Multivitamins and minerals also reviewed. Teach back method used, pt expressed understanding, expect good compliance.   Diet: First 2 Weeks  You will see the nutritionist about two (2) weeks after your surgery. The nutritionist will increase the types of foods you can eat if you are handling liquids well:  If you have severe vomiting or nausea and cannot handle clear liquids lasting longer than 1 day, call your surgeon  Protein Shake  Drink at least 2 ounces of shake 5-6 times per day  Each serving of protein shakes (usually 8 - 12 ounces) should have a minimum of:  15 grams of protein  And no more than 5 grams of carbohydrate  Goal for protein each day:  Men = 80 grams per day  Women = 60 grams per day  Protein powder may be added to fluids such as non-fat milk or Lactaid milk or Soy milk (limit to 35 grams added protein powder per serving)   Hydration  Slowly increase the amount of water and other clear liquids as tolerated (See Acceptable Fluids)  Slowly increase the amount of protein shake as tolerated  Sip fluids slowly and throughout the day  May use sugar substitutes in small amounts (no more than 6 - 8 packets per day; i.e. Splenda)   Fluid Goal  The first goal is to drink at least 8 ounces of protein shake/drink per day (or as directed  by the nutritionist); some examples of protein shakes are [employer], [employer], EAS Edge HP, and Unjury. See handout from pre-op Bariatric Education Class:  Slowly increase the amount of protein shake you drink as tolerated  You may find it easier to slowly sip shakes throughout the day  It is important to get your proteins in first  Your fluid goal is to drink 64 - 100 ounces of fluid daily  It may take a few weeks to build up to this  32 oz (or more) should be clear liquids  And  32 oz (or more) should be full liquids (see below for examples)  Liquids should not contain sugar, caffeine, or carbonation   Clear Liquids:  Water or Sugar-free flavored water (i.e. Fruit H2O, Propel)  Decaffeinated coffee or tea (sugar-free)  Crystal Lite, Wyler's Lite, Minute Maid Lite  Sugar-free Jell-O  Bouillon or broth  Sugar-free Popsicle: *Less than 20 calories each; Limit 1 per day   Full Liquids:  Protein Shakes/Drinks + 2 choices per day of other full liquids  Full liquids must be:  No More Than 12 grams of Carbs per serving  No More Than 3 grams of Fat per serving  Strained low-fat cream soup  Non-Fat milk  Fat-free Lactaid Milk  Sugar-free yogurt (Dannon Lite & Fit, Greek yogurt)     Clayton Bibles, MS, RD, LDN Pager: [phone] After Hours Pager: [phone]

## 2015-04-20 ENCOUNTER — Telehealth (HOSPITAL_COMMUNITY): Payer: Self-pay

## 2015-04-20 NOTE — Telephone Encounter (Signed)
Attempted Drop Discharge call, no answer, left message to return call   Made discharge phone call to patient per DROP protocol. Asking the following questions.    1. Do you have someone to care for you now that you are home?   2. Are you having pain now that is not relieved by your pain medication?   3. Are you able to drink the recommended daily amount of fluids (48 ounces minimum/day) and protein (60-80 grams/day) as prescribed by the dietitian or nutritional counselor?   4. Are you taking the vitamins and minerals as prescribed?   5. Do you have the "on call" number to contact your surgeon if you have a problem or question?   6. Are your incisions free of redness, swelling or drainage? (If steri strips, address that these can fall off, shower as tolerated)  7. Have your bowels moved since your surgery?  If not, are you passing gas?   8. Are you up and walking 3-4 times per day?

## 2015-04-28 ENCOUNTER — Encounter: Payer: Managed Care, Other (non HMO) | Attending: Surgery

## 2015-04-28 DIAGNOSIS — Z713 Dietary counseling and surveillance: Secondary | ICD-10-CM | POA: Insufficient documentation

## 2015-04-28 DIAGNOSIS — Z6841 Body Mass Index (BMI) 40.0 and over, adult: Secondary | ICD-10-CM | POA: Diagnosis not present

## 2015-04-28 DIAGNOSIS — E669 Obesity, unspecified: Secondary | ICD-10-CM | POA: Insufficient documentation

## 2015-04-28 NOTE — Progress Notes (Signed)
Bariatric Class:  Appt start time: 1530 end time:  1630.  2 Week Post-Operative Nutrition Class  Patient was seen on 04/28/15 for Post-[facility]ative Nutrition education at [facility].   Surgery date: 04/13/15 Surgery type: RYGB Start weight at [facility]: 255 lbs on 01/03/15 Weight today: 238.9 lbs  Weight change: 16.6 lbs  TANITA  BODY COMP RESULTS  03/23/15 04/28/15   BMI (kg/m^2) 41.2 N/A   Fat Mass (lbs) 119.5    Fat Free Mass (lbs) 136    Total Body Water (lbs) 99.5      The following the learning objectives were met by the patient during this course:  Identifies Phase 3A (Soft, High Proteins) Dietary Goals and will begin from 2 weeks post-operatively to 2 months post-operatively  Identifies appropriate sources of fluids and proteins   States protein recommendations and appropriate sources post-operatively  Identifies the need for appropriate texture modifications, mastication, and bite sizes when consuming solids  Identifies appropriate multivitamin and calcium sources post-operatively  Describes the need for physical activity post-operatively and will follow MD recommendations  States when to call healthcare provider regarding medication questions or post-operative complications  Handouts given during class include:  Phase 3A: Soft, High Protein Diet Handout  Follow-Up Plan: Patient will follow-up at [facility] in 6 weeks for 2 month post-op nutrition visit for diet advancement per MD.

## 2015-06-10 ENCOUNTER — Ambulatory Visit: Payer: Managed Care, Other (non HMO) | Admitting: Dietician

## 2016-04-14 ENCOUNTER — Encounter (HOSPITAL_COMMUNITY): Payer: Self-pay

## 2016-06-09 IMAGING — RF DG UGI W/ GASTROGRAFIN
5 series · 8 of 8 positions shown · IV contrast (omnipaque)
Comparison: 11/26/2014

CLINICAL DATA: Postop for gastric bypass.  Lap band revision.

EXAM:
WATER SOLUBLE UPPER GI SERIES
TECHNIQUE: Single-column upper GI series was performed using water soluble
contrast.
CONTRAST:  50mL OMNIPAQUE IOHEXOL 300 MG/ML  SOLN

[Series 1: t abdomen supine · 0.15mm/px · 1 of 1 slices shown]
[im 1/1]
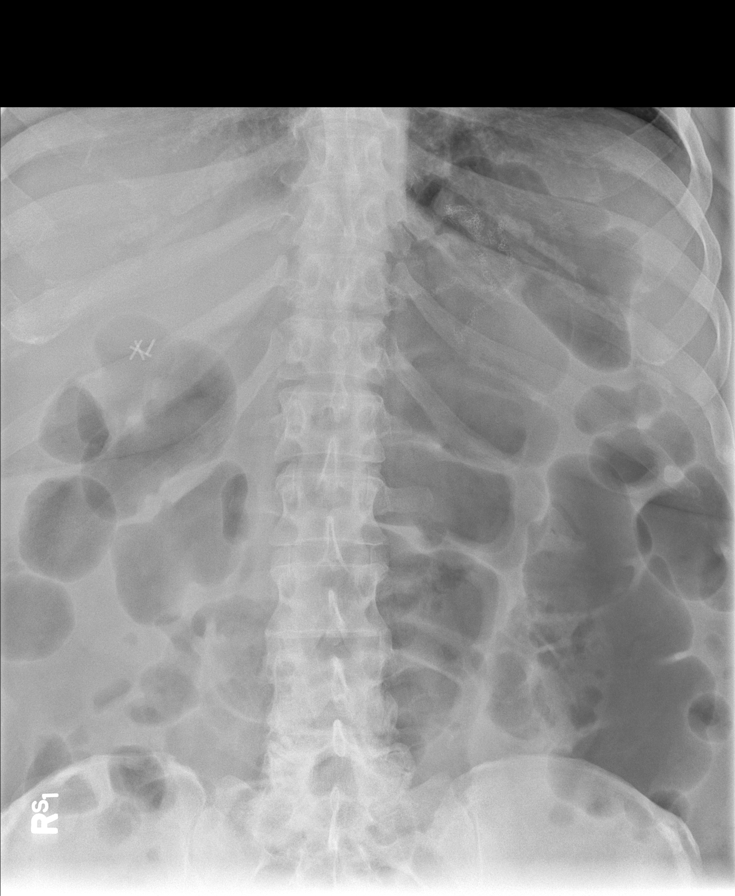

[Series 2: cp_standard · 0.27mm/px · 1 of 1 slices shown (1 of 4)]
[im 1/1]
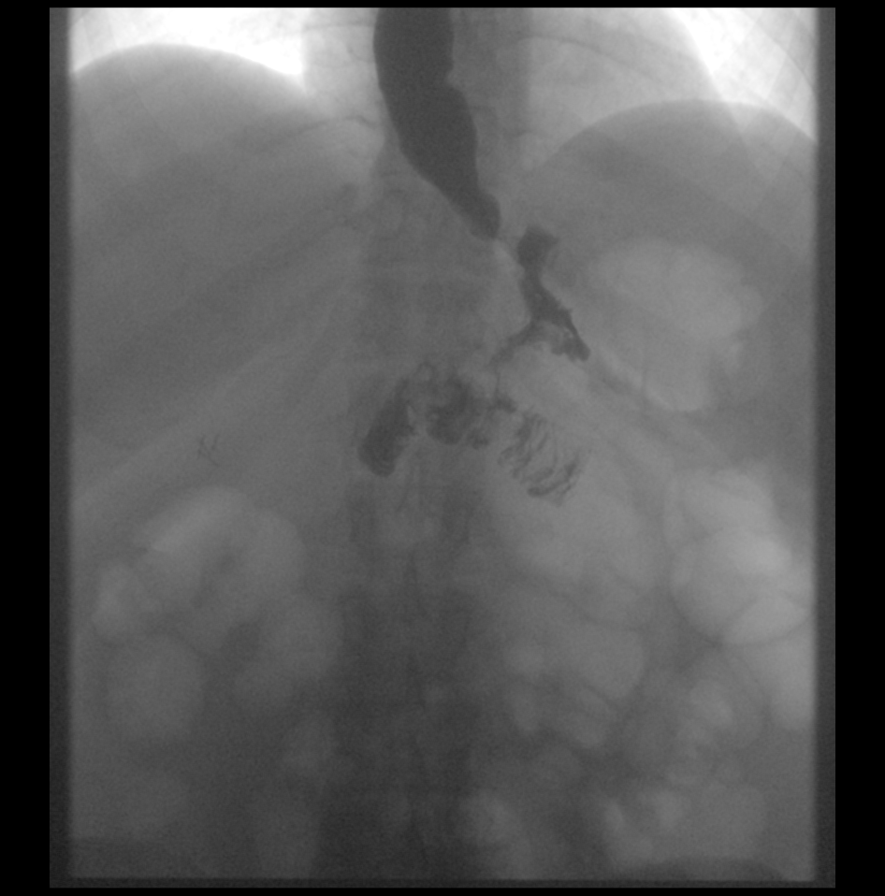

[Series 3: cp_standard · 0.18mm/px · 1 of 1 slices shown (2 of 4)]
[im 1/1]
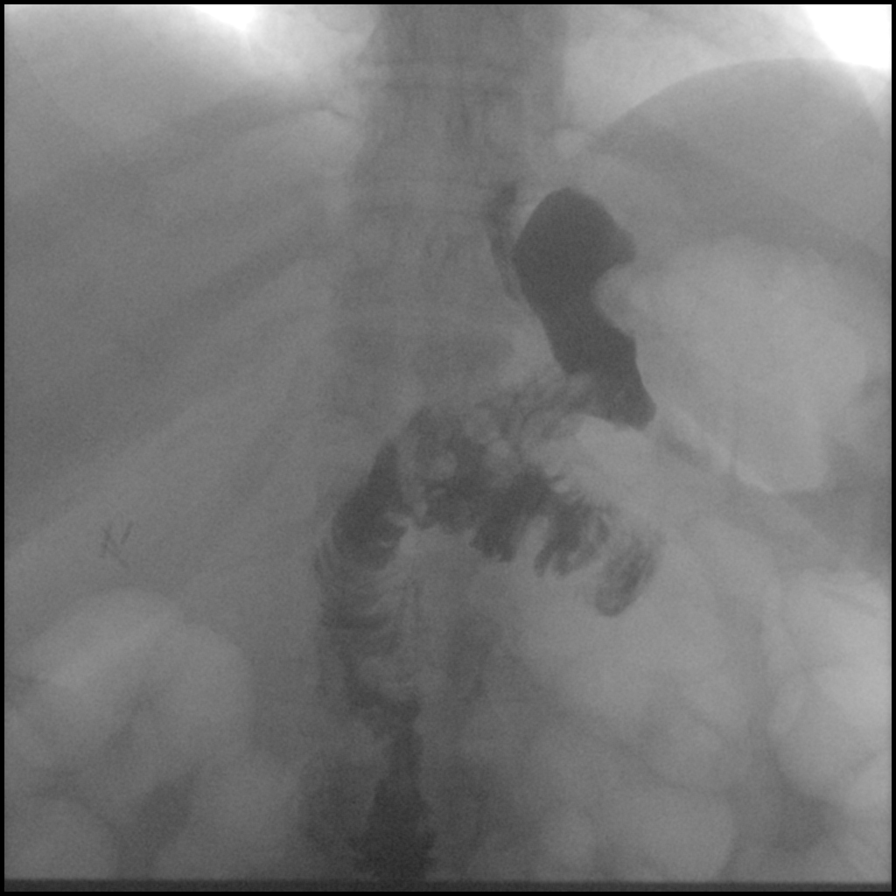

[Series 4: cp_standard · 0.37mm/px · 4 of 184 frames shown (3 of 4)]
[frame 17/184]
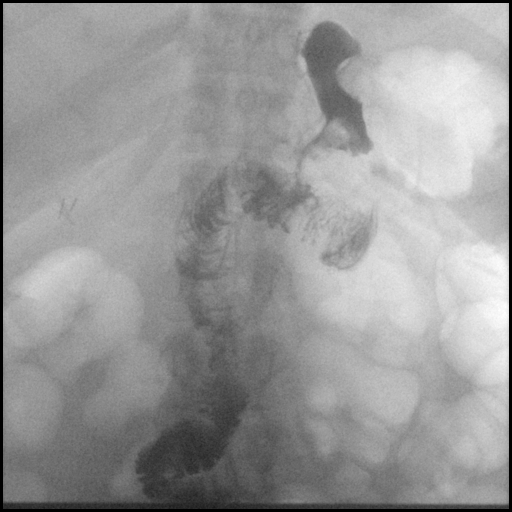
[frame 28/184]
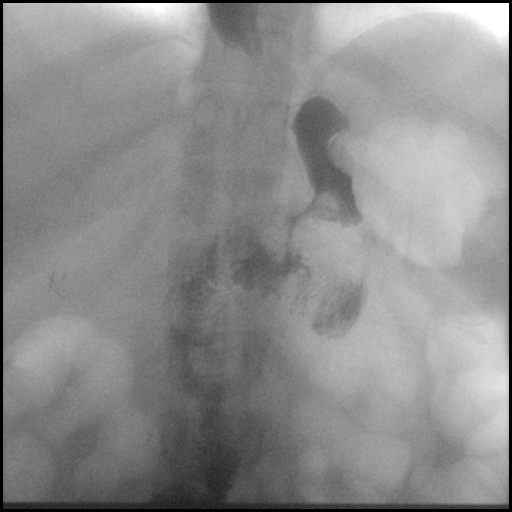
[frame 93/184]
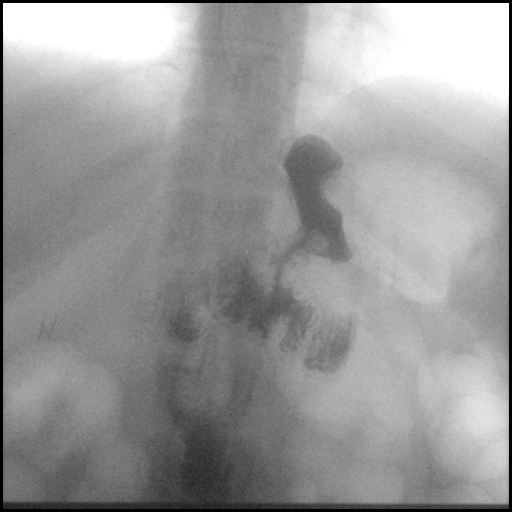
[frame 157/184]
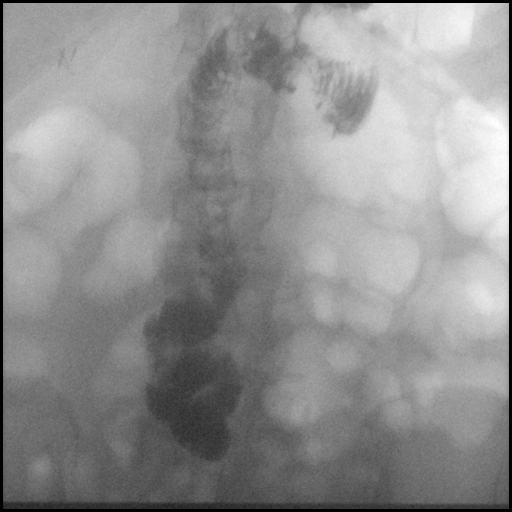

[Series 5: cp_standard · 0.27mm/px · 1 of 1 slices shown (4 of 4)]
[im 1/1]
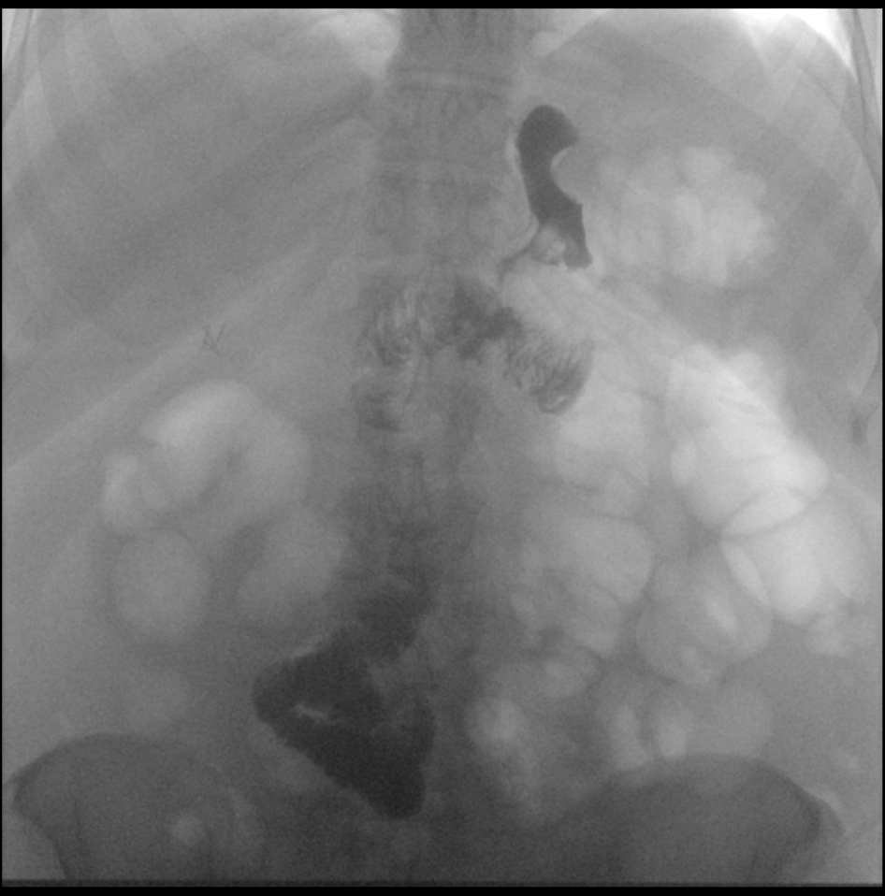

[8 of 8 positions shown; findings below may reference images not displayed]

FLUOROSCOPY TIME:  If the device does not provide the exposure
index:

Fluoroscopy Time (in minutes and seconds):  1 minutes and 6 seconds

Number of Acquired Images:  None
FINDINGS: Preprocedure scout film demonstrates prior cholecystectomy. No free
intraperitoneal air.

Focused single-contrast exam demonstrates expected postoperative
anatomy of gastric bypass with Roux-en-Y gastrojejunostomy. No
contrast extravasation. No bowel obstruction.
IMPRESSION: Expected appearance after gastric bypass and gastrojejunostomy.

## 2016-11-17 ENCOUNTER — Encounter (HOSPITAL_COMMUNITY): Payer: Self-pay
